# Patient Record
Sex: Female | Born: 1980 | Race: Black or African American | Hispanic: No | Marital: Single | Smoking: Never smoker
Health system: Southern US, Community
[De-identification: ages and names within clinical notes are randomized; demographics above are authoritative.]

## PROBLEM LIST (undated history)

## (undated) DIAGNOSIS — J45909 Unspecified asthma, uncomplicated: Secondary | ICD-10-CM

## (undated) HISTORY — PX: WISDOM TOOTH EXTRACTION: SHX21

---
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
## 2002-08-11 ENCOUNTER — Emergency Department (HOSPITAL_COMMUNITY): Admission: EM | Admit: 2002-08-11 | Discharge: 2002-08-11 | Payer: Self-pay | Admitting: Emergency Medicine

## 2002-08-11 ENCOUNTER — Encounter: Payer: Self-pay | Admitting: Emergency Medicine

## 2002-08-13 ENCOUNTER — Encounter: Payer: Self-pay | Admitting: Emergency Medicine

## 2002-08-13 ENCOUNTER — Emergency Department (HOSPITAL_COMMUNITY): Admission: EM | Admit: 2002-08-13 | Discharge: 2002-08-13 | Payer: Self-pay | Admitting: Emergency Medicine

## 2002-11-11 ENCOUNTER — Emergency Department (HOSPITAL_COMMUNITY): Admission: EM | Admit: 2002-11-11 | Discharge: 2002-11-11 | Payer: Self-pay | Admitting: Emergency Medicine

## 2002-11-11 ENCOUNTER — Encounter: Payer: Self-pay | Admitting: Emergency Medicine

## 2003-01-01 ENCOUNTER — Emergency Department (HOSPITAL_COMMUNITY): Admission: EM | Admit: 2003-01-01 | Discharge: 2003-01-01 | Payer: Self-pay | Admitting: Emergency Medicine

## 2003-01-02 ENCOUNTER — Emergency Department (HOSPITAL_COMMUNITY): Admission: EM | Admit: 2003-01-02 | Discharge: 2003-01-02 | Payer: Self-pay | Admitting: *Deleted

## 2003-08-21 ENCOUNTER — Emergency Department (HOSPITAL_COMMUNITY): Admission: EM | Admit: 2003-08-21 | Discharge: 2003-08-21 | Payer: Self-pay | Admitting: Emergency Medicine

## 2003-08-21 ENCOUNTER — Encounter: Payer: Self-pay | Admitting: Emergency Medicine

## 2003-11-30 ENCOUNTER — Emergency Department (HOSPITAL_COMMUNITY): Admission: EM | Admit: 2003-11-30 | Discharge: 2003-12-01 | Payer: Self-pay | Admitting: Emergency Medicine

## 2004-01-06 ENCOUNTER — Emergency Department (HOSPITAL_COMMUNITY): Admission: EM | Admit: 2004-01-06 | Discharge: 2004-01-07 | Payer: Self-pay | Admitting: Emergency Medicine

## 2004-02-10 ENCOUNTER — Emergency Department (HOSPITAL_COMMUNITY): Admission: EM | Admit: 2004-02-10 | Discharge: 2004-02-10 | Payer: Self-pay | Admitting: Emergency Medicine

## 2004-02-16 ENCOUNTER — Emergency Department (HOSPITAL_COMMUNITY): Admission: EM | Admit: 2004-02-16 | Discharge: 2004-02-16 | Payer: Self-pay | Admitting: Emergency Medicine

## 2004-03-31 ENCOUNTER — Inpatient Hospital Stay (HOSPITAL_COMMUNITY): Admission: EM | Admit: 2004-03-31 | Discharge: 2004-04-05 | Payer: Self-pay | Admitting: Psychiatry

## 2004-10-09 ENCOUNTER — Emergency Department (HOSPITAL_COMMUNITY): Admission: EM | Admit: 2004-10-09 | Discharge: 2004-10-10 | Payer: Self-pay | Admitting: Emergency Medicine

## 2006-01-19 ENCOUNTER — Emergency Department (HOSPITAL_COMMUNITY): Admission: EM | Admit: 2006-01-19 | Discharge: 2006-01-19 | Payer: Self-pay | Admitting: Emergency Medicine

## 2006-02-04 ENCOUNTER — Emergency Department (HOSPITAL_COMMUNITY): Admission: EM | Admit: 2006-02-04 | Discharge: 2006-02-04 | Payer: Self-pay | Admitting: Emergency Medicine

## 2006-07-26 ENCOUNTER — Emergency Department (HOSPITAL_COMMUNITY): Admission: EM | Admit: 2006-07-26 | Discharge: 2006-07-26 | Payer: Self-pay | Admitting: Emergency Medicine

## 2006-09-21 ENCOUNTER — Encounter (INDEPENDENT_AMBULATORY_CARE_PROVIDER_SITE_OTHER): Payer: Self-pay | Admitting: *Deleted

## 2006-09-21 ENCOUNTER — Ambulatory Visit: Payer: Self-pay | Admitting: Internal Medicine

## 2006-11-27 ENCOUNTER — Ambulatory Visit: Payer: Self-pay | Admitting: *Deleted

## 2006-12-28 ENCOUNTER — Emergency Department (HOSPITAL_COMMUNITY): Admission: EM | Admit: 2006-12-28 | Discharge: 2006-12-28 | Payer: Self-pay | Admitting: Emergency Medicine

## 2007-07-31 ENCOUNTER — Encounter (INDEPENDENT_AMBULATORY_CARE_PROVIDER_SITE_OTHER): Payer: Self-pay | Admitting: *Deleted

## 2007-11-12 ENCOUNTER — Telehealth (INDEPENDENT_AMBULATORY_CARE_PROVIDER_SITE_OTHER): Payer: Self-pay | Admitting: *Deleted

## 2007-12-06 ENCOUNTER — Ambulatory Visit: Payer: Self-pay | Admitting: Internal Medicine

## 2007-12-06 ENCOUNTER — Encounter (INDEPENDENT_AMBULATORY_CARE_PROVIDER_SITE_OTHER): Payer: Self-pay | Admitting: Internal Medicine

## 2007-12-06 DIAGNOSIS — E282 Polycystic ovarian syndrome: Secondary | ICD-10-CM

## 2007-12-10 ENCOUNTER — Encounter (INDEPENDENT_AMBULATORY_CARE_PROVIDER_SITE_OTHER): Payer: Self-pay | Admitting: Internal Medicine

## 2007-12-10 LAB — CONVERTED CEMR LAB
Chlamydia, DNA Probe: POSITIVE — AB
GC Probe Amp, Genital: NEGATIVE
Insulin: 8 microintl units/mL (ref 3–28)

## 2008-11-24 ENCOUNTER — Telehealth (INDEPENDENT_AMBULATORY_CARE_PROVIDER_SITE_OTHER): Payer: Self-pay | Admitting: *Deleted

## 2009-03-02 ENCOUNTER — Encounter (INDEPENDENT_AMBULATORY_CARE_PROVIDER_SITE_OTHER): Payer: Self-pay | Admitting: Internal Medicine

## 2009-03-02 ENCOUNTER — Ambulatory Visit: Payer: Self-pay | Admitting: Internal Medicine

## 2009-03-02 DIAGNOSIS — R1012 Left upper quadrant pain: Secondary | ICD-10-CM | POA: Insufficient documentation

## 2009-03-02 DIAGNOSIS — J309 Allergic rhinitis, unspecified: Secondary | ICD-10-CM | POA: Insufficient documentation

## 2009-03-02 DIAGNOSIS — E049 Nontoxic goiter, unspecified: Secondary | ICD-10-CM | POA: Insufficient documentation

## 2009-03-02 LAB — CONVERTED CEMR LAB
AST: 15 units/L (ref 0–37)
Alkaline Phosphatase: 41 units/L (ref 39–117)
BUN: 11 mg/dL (ref 6–23)
Basophils Absolute: 0 10*3/uL (ref 0.0–0.1)
Calcium: 8.9 mg/dL (ref 8.4–10.5)
Chloride: 112 meq/L (ref 96–112)
Creatinine, Ser: 0.91 mg/dL (ref 0.40–1.20)
Eosinophils Absolute: 0.2 10*3/uL (ref 0.0–0.7)
Eosinophils Relative: 2 % (ref 0–5)
HCT: 34.9 % — ABNORMAL LOW (ref 36.0–46.0)
HDL: 66 mg/dL (ref >39)
KOH Prep: NEGATIVE
MCHC: 32.1 g/dL (ref 30.0–36.0)
MCV: 94.6 fL (ref 78.0–100.0)
Platelets: 386 10*3/uL (ref 150–400)
RDW: 12.5 % (ref 11.5–15.5)
TSH: 1.132 microintl units/mL (ref 0.350–4.500)
Total CHOL/HDL Ratio: 2.7

## 2009-03-11 ENCOUNTER — Telehealth (INDEPENDENT_AMBULATORY_CARE_PROVIDER_SITE_OTHER): Payer: Self-pay | Admitting: Internal Medicine

## 2009-03-12 ENCOUNTER — Encounter (INDEPENDENT_AMBULATORY_CARE_PROVIDER_SITE_OTHER): Payer: Self-pay | Admitting: Internal Medicine

## 2009-12-23 ENCOUNTER — Ambulatory Visit: Payer: Self-pay | Admitting: Internal Medicine

## 2009-12-23 DIAGNOSIS — L293 Anogenital pruritus, unspecified: Secondary | ICD-10-CM | POA: Insufficient documentation

## 2009-12-23 LAB — CONVERTED CEMR LAB
Blood in Urine, dipstick: NEGATIVE
KOH Prep: NEGATIVE
Nitrite: NEGATIVE
WBC Urine, dipstick: NEGATIVE

## 2010-01-15 ENCOUNTER — Encounter (INDEPENDENT_AMBULATORY_CARE_PROVIDER_SITE_OTHER): Payer: Self-pay | Admitting: Internal Medicine

## 2010-02-22 ENCOUNTER — Ambulatory Visit: Payer: Self-pay | Admitting: Internal Medicine

## 2010-02-22 DIAGNOSIS — R3915 Urgency of urination: Secondary | ICD-10-CM | POA: Insufficient documentation

## 2010-02-22 DIAGNOSIS — F329 Major depressive disorder, single episode, unspecified: Secondary | ICD-10-CM | POA: Insufficient documentation

## 2010-02-22 DIAGNOSIS — N3941 Urge incontinence: Secondary | ICD-10-CM | POA: Insufficient documentation

## 2010-02-22 DIAGNOSIS — D649 Anemia, unspecified: Secondary | ICD-10-CM | POA: Insufficient documentation

## 2010-02-22 LAB — CONVERTED CEMR LAB
Bilirubin Urine: NEGATIVE
Blood in Urine, dipstick: NEGATIVE
Glucose, Urine, Semiquant: NEGATIVE
Ketones, urine, test strip: NEGATIVE
Protein, U semiquant: NEGATIVE
Whiff Test: POSITIVE

## 2010-02-25 LAB — CONVERTED CEMR LAB
Chlamydia, DNA Probe: NEGATIVE
GC Probe Amp, Genital: NEGATIVE
Lymphs Abs: 2.3 10*3/uL (ref 0.7–4.0)
Monocytes Relative: 7 % (ref 3–12)
Neutro Abs: 3.6 10*3/uL (ref 1.7–7.7)
Neutrophils Relative %: 55 % (ref 43–77)
RBC: 3.8 M/uL — ABNORMAL LOW (ref 3.87–5.11)
WBC: 6.5 10*3/uL (ref 4.0–10.5)

## 2010-02-28 ENCOUNTER — Ambulatory Visit (HOSPITAL_COMMUNITY): Admission: RE | Admit: 2010-02-28 | Discharge: 2010-02-28 | Payer: Self-pay | Admitting: Internal Medicine

## 2010-02-28 IMAGING — US US RENAL
1 series · 14 of 25 positions shown · non-contrast
Comparison: None.

CLINICAL DATA: Urinary frequency and problem emptying bladder

RENAL/URINARY TRACT ULTRASOUND COMPLETE

[Series 1: us renal · 0.26mm/px · 14 of 32 slices shown]
[im 1/32]
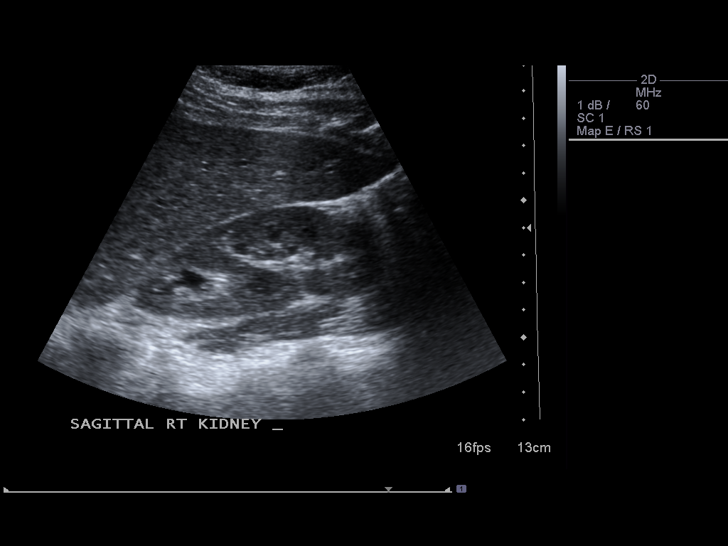
[im 3/32]
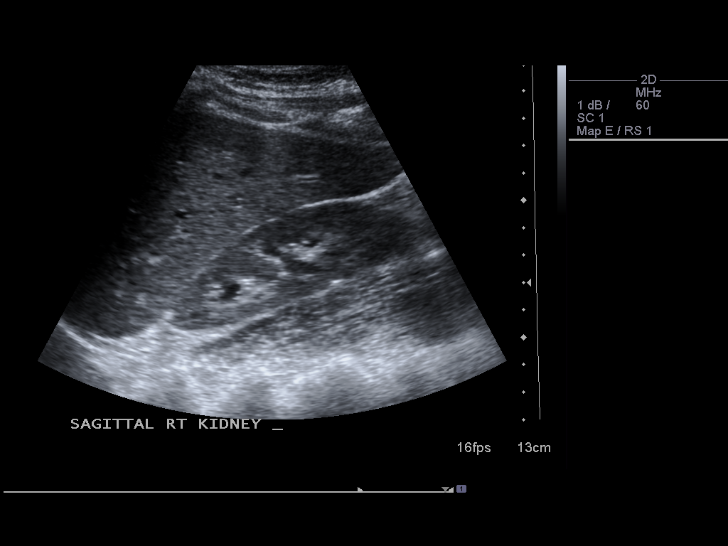
[im 6/32]
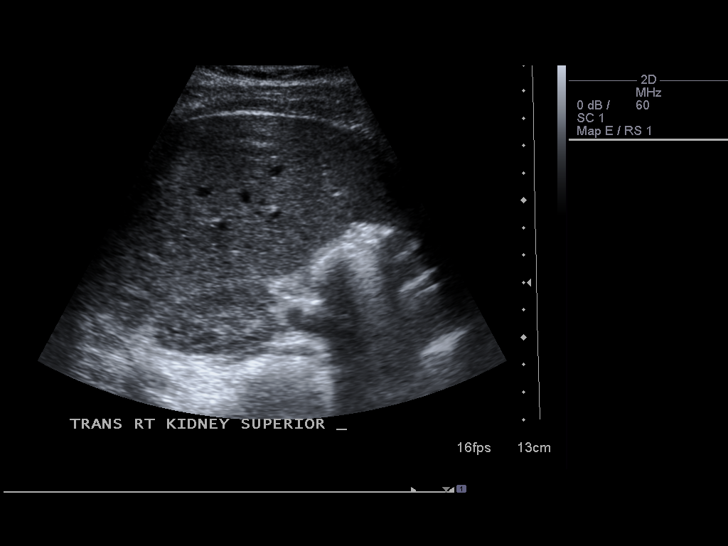
[im 8/32]
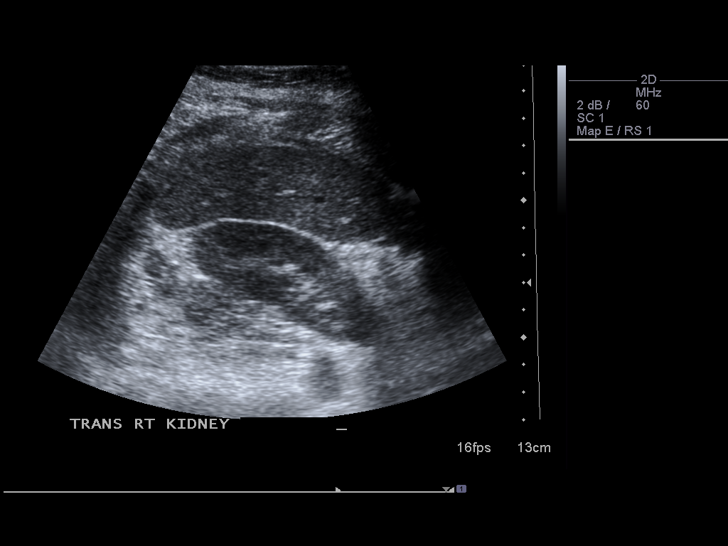
[im 11/32]
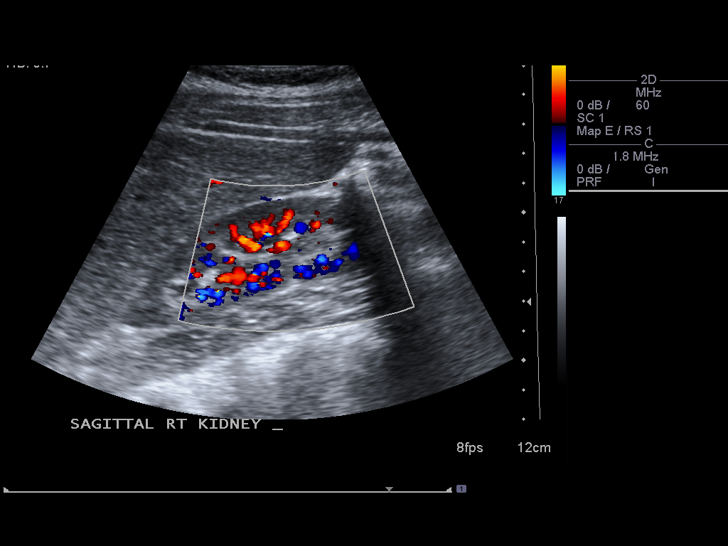
[im 12/32]
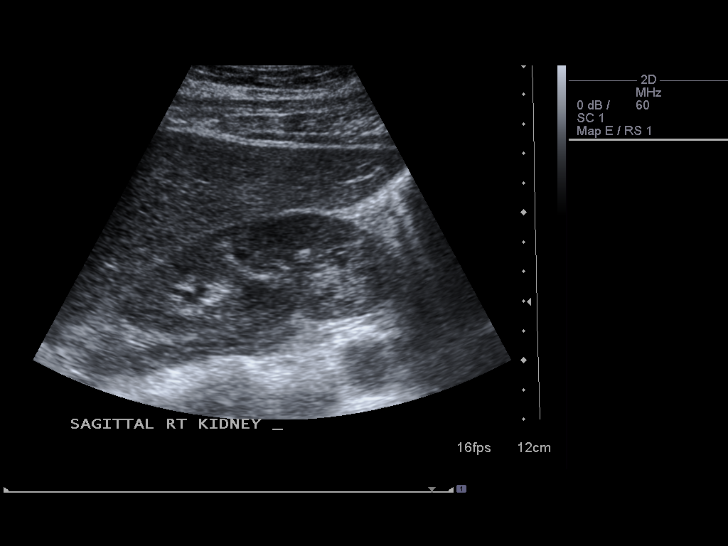
[im 15/32]
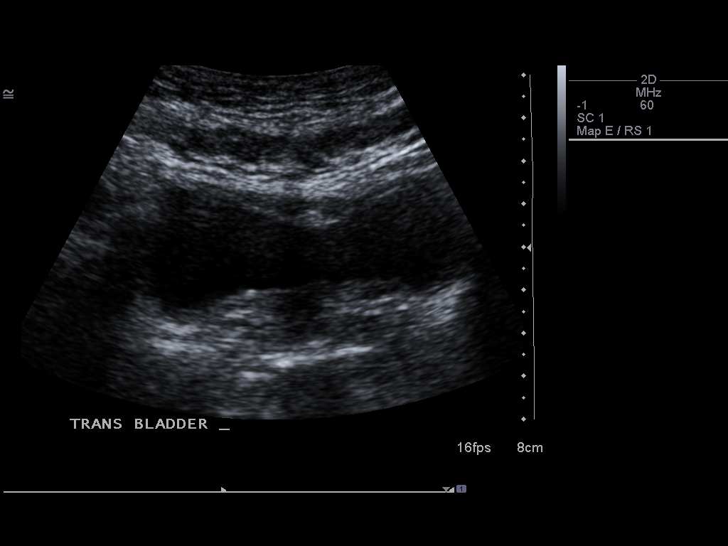
[im 17/32]
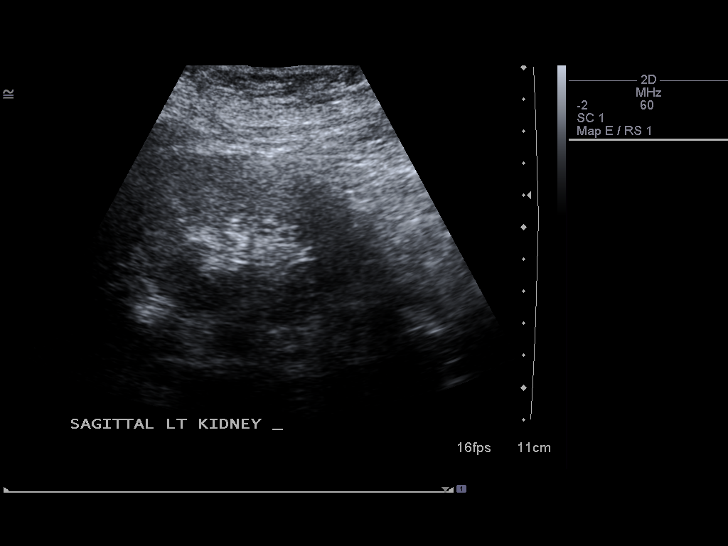
[im 20/32]
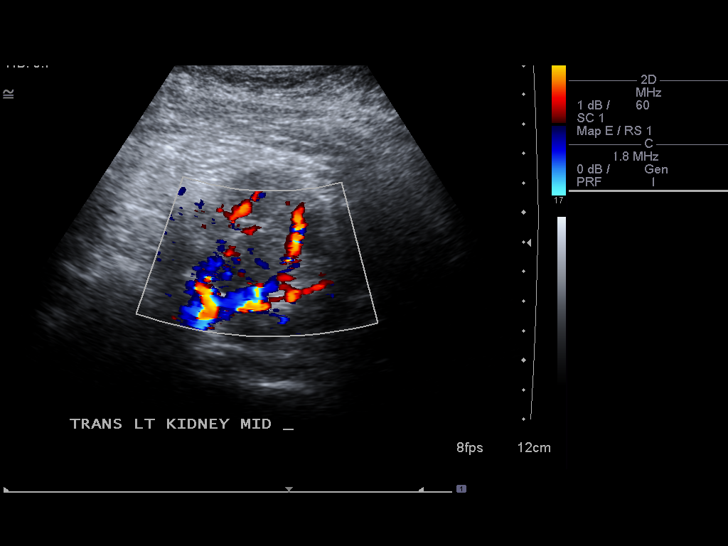
[im 21/32]
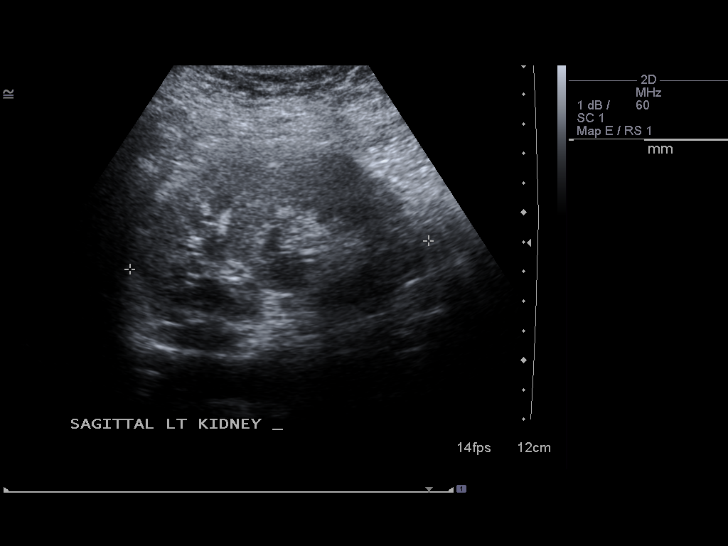
[im 24/32]
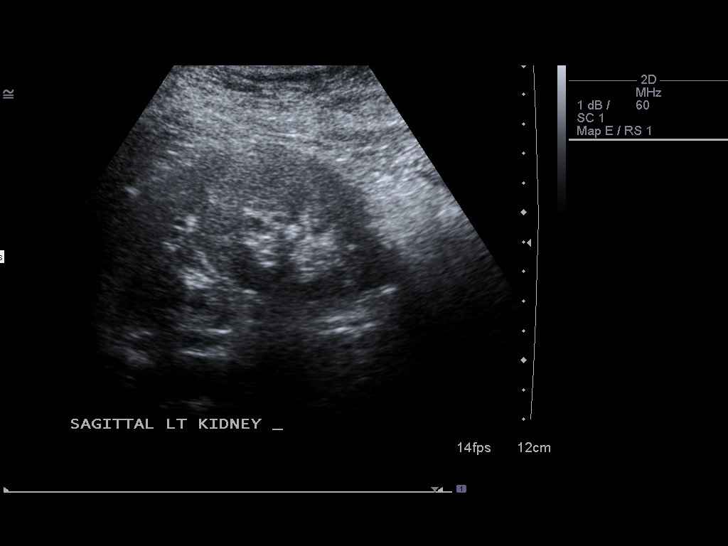
[im 26/32]
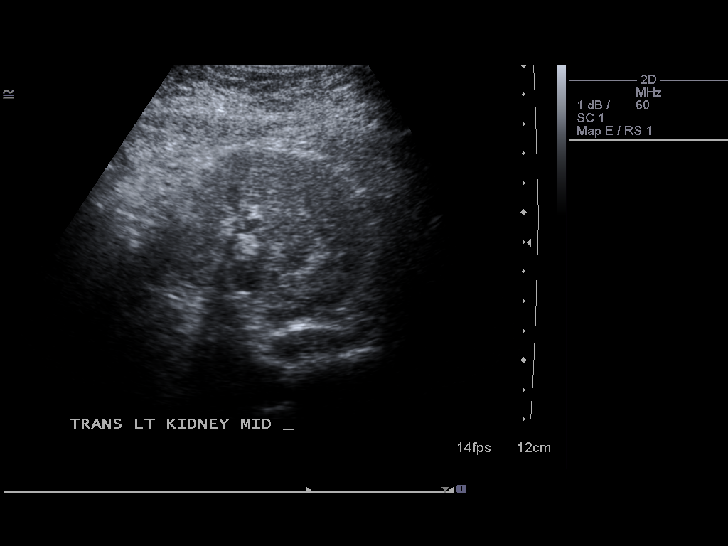
[im 29/32]
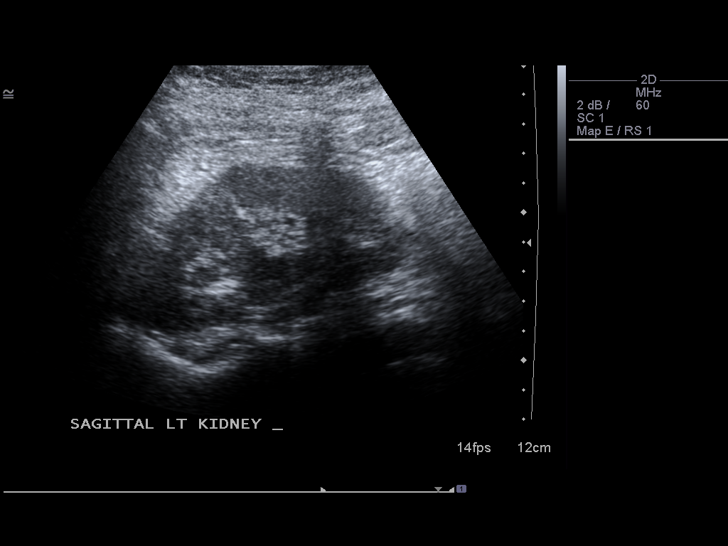
[im 32/32]
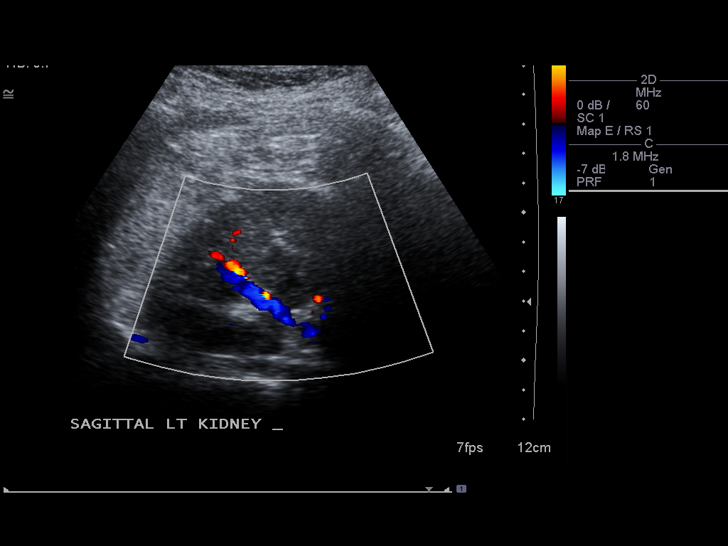

[14 of 25 positions shown; findings below may reference images not displayed]

FINDINGS: Right Kidney:  Normal in size and parenchymal echogenicity.  No
evidence of mass or hydronephrosis.

Left Kidney:  Normal in size and parenchymal echogenicity.  No
evidence of mass or hydronephrosis.

Bladder:  Appears normal for degree of bladder distention.
IMPRESSION: Normal study.

## 2010-03-08 ENCOUNTER — Ambulatory Visit: Payer: Self-pay | Admitting: Internal Medicine

## 2010-12-15 NOTE — Letter (Signed)
Summary: TEST ORDER FORM//ULTRASOUND//APPT DATE & TIME  TEST ORDER FORM//ULTRASOUND//APPT DATE & TIME   Imported By: Arta Bruce 02/22/2010 11:27:32  _____________________________________________________________________  External Attachment:    Type:   Image     Comment:   External Document

## 2010-12-15 NOTE — Letter (Signed)
Summary: AMANDA'S SUMMARY  AMANDA'S SUMMARY   Imported By: Arta Bruce 04/07/2010 12:39:30  _____________________________________________________________________  External Attachment:    Type:   Image     Comment:   External Document

## 2010-12-15 NOTE — Assessment & Plan Note (Signed)
Summary: CPP////KT   Vital Signs:  Patient profile:   31 year old female LMP:     02/01/2010 Weight:      165 pounds Temp:     98.2 degrees F Pulse rate:   72 / minute Pulse rhythm:   regular Resp:     18 per minute BP sitting:   113 / 73  (left arm) Cuff size:   regular  Vitals Entered By: Chauncy Passy, SMA CC: Pt. is here for a 30 y/o CPP. Pt. states she has noticed she's been urinating frequently for the last past 6-8 months.  Is Patient Diabetic? No Pain Assessment Patient in pain? no       Does patient need assistance? Functional Status Self care Ambulation Normal LMP (date): 02/01/2010     Enter LMP: 02/01/2010 Last PAP Result NEGATIVE FOR INTRAEPITHELIAL LESIONS OR MALIGNANCY.   CC:  Pt. is here for a 30 y/o CPP. Pt. states she has noticed she's been urinating frequently for the last past 6-8 months. .  History of Present Illness: 30 yo female here for CPP.  Concerns:  1.  Urinary frequency in past 6 mos-1 year.  Nocturia x 2.   Can be just a little amount or a lot.  Has been doing Kegels and has helped.  Does not drink much in way of caffeine.  Alcohol only once monthly.  No burning on urination.  Often feels like she does not completely empty.  In past, has had times where urine has leaked as she could not make it to bathroom fast enough.   2.  Asthma:  did not get flu shot this past year.  Uses Proventil only once ever 4-6 months.  Seasonal allergies are a definite trigger and she is out of Fexofenadine.    Habits & Providers  Alcohol-Tobacco-Diet     Alcohol drinks/day: <1     Tobacco Status: quit < 6 months     Year Started: age 60  Exercise-Depression-Behavior     Drug Use: never  Current Medications (verified): 1)  Desogen 0.15-30 Mg-Mcg  Tabs (Desogestrel-Ethinyl Estradiol) .... Take 1 Tablet By Mouth Once A Day As Directed 2)  Proventil Hfa 108 (90 Base) Mcg/act  Aers (Albuterol Sulfate) .... Use 2 Puffs Every 4 Hours As Needed 3)   Fexofenadine Hcl 180 Mg Tabs (Fexofenadine Hcl) .Marland Kitchen.. 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 None  Family History: Mother, 25:  Thyroid disease, lung cancer--treated with radiation Father, 32:  DM-insulin requiring, depression Sister, 76: Healthy, overweight. No children.  Social History: Lives alone now. Works with [employer].  Getting CNA /med Actuary.  [employer]. Does have a significant other Tobacco Use:  Started age 59, quits on and off--currently off for 2 months  Smoked 1 pack in 3 weeks. Alcohol Use:  Rare. Drug Use: never.Smoking Status:  quit < 6 months Drug Use:  never  Review of Systems General:  Energy is better--has made lifestyle changes--losing weight.. Eyes:  Denies blurring. ENT:  Denies decreased hearing. CV:  Denies chest pain or discomfort and palpitations. Resp:  No dyspnea for some time with asthma. GI:  Denies abdominal pain,  bloody stools, constipation, dark tarry stools, and diarrhea. GU:  Denies discharge; periods regular with BCPs.  Can be somewhat heavy--but nothing like before started BCPs.. MS:  Occasional cramping of hands--when trying to open jar, etc. Can have numbness of hands at night--not frequent.. Derm:  Denies lesion(s) and rash. Neuro:  Denies numbness, tingling, and weakness; other than hands as noted previously. Psych:  Denies anxiety and depression; Has had a history of depression--was at [facility] at one point.  Does have episodes of coping problems--doesn't want to go to  work, wants to stay home and not see anyone--once every 1-2 months.  Feels physically ill with this at times..  Physical Exam  General:  Well-developed,well-nourished,in no acute distress; alert,appropriate and cooperative throughout examination Head:  Normocephalic and atraumatic without obvious abnormalities. No apparent alopecia or balding. Eyes:  No corneal or conjunctival inflammation noted. EOMI. Perrla. Funduscopic exam benign, without hemorrhages, exudates or papilledema. Vision grossly normal. Ears:  External ear exam shows no significant lesions or deformities.  Otoscopic examination reveals clear canals, tympanic membranes are intact bilaterally without bulging, retraction, inflammation or discharge. Hearing is grossly normal bilaterally. Nose:  External nasal examination shows no deformity or inflammation. Nasal mucosa are pink and moist without lesions or exudates. Mouth:  Oral mucosa and oropharynx without lesions or exudates.  Teeth in good repair. Neck:  No deformities, masses, or tenderness noted.  No thyromegaly noted today. Breasts:  No mass, nodules, thickening, tenderness, bulging, retraction, inflamation, nipple discharge or skin changes noted.   Lungs:  Normal respiratory effort, chest expands symmetrically. Lungs are clear to auscultation, no crackles or wheezes. Heart:  Normal rate and regular rhythm. S1 and S2 normal without gallop, murmur, click, rub or other extra sounds. Abdomen:  Bowel sounds positive,abdomen soft and non-tender without masses, organomegaly or hernias noted. Genitalia:  Pelvic Exam:        External: normal female genitalia without lesions or masses        Vagina: normal without lesions or masses        Cervix: normal without lesions or masses.  Scant frothy thin yellow discharge.  No inflammation of cervix        Adnexa: normal bimanual exam without masses or fullness        Uterus: normal by palpation        Pap smear: performed Msk:  No deformity  or scoliosis noted of thoracic or lumbar spine.   Pulses:  R and L carotid,radial,femoral,dorsalis pedis and posterior tibial pulses are full and equal bilaterally Extremities:  No clubbing, cyanosis, edema, or deformity noted with normal full range of motion of all joints.   Neurologic:  No cranial nerve deficits noted. Station and gait are normal. Plantar reflexes are down-going bilaterally. DTRs are symmetrical throughout. Sensory, motor and coordinative functions appear intact.  No Tinel's or Phalen's at wrists. Skin:  Several small tattoos. umbilical piercing. Cervical Nodes:  No lymphadenopathy noted Axillary Nodes:  No palpable lymphadenopathy Inguinal Nodes:  No significant adenopathy Psych:  Cognition and judgment appear intact. Alert and cooperative with normal attention span and concentration. No apparent delusions, illusions, hallucinations   Impression & Recommendations:  Problem # 1:  ROUTINE GYNECOLOGICAL EXAMINATION (ICD-V72.31) Encouraged more dairy intake Orders: UA Dipstick w/o Micro (manual) (13244) Pap Smear, Thin Prep ( Collection of) [phone]) KOH/ WET Mount [phone]) T- GC Chlamydia [phone]) T-HIV Antibody  (Reflex) [phone]) T-Pap Smear, Thin Prep (56433) T-Syphilis Test (RPR) ([id number]) Ultrasound (Ultrasound)  Problem # 2:  ANEMIA (  ICD-285.9)  Orders: T-CBC w/Diff ([id number])  Problem # 3:  ASTHMA (ICD-493.90) Encouraged yearly flu and to stay off cigarettes. Her updated medication list for this problem includes:    Proventil Hfa 108 (90 Base) Mcg/act Aers (Albuterol sulfate) ..... Use 2 puffs every 4 hours as needed  Problem # 4:  DEPRESSION (ICD-311) PHQ -9 scored at 11 as well. Orders: Psychology Referral (Psychology)  Problem # 5:  URINARY URGENCY, CHRONIC (ICD-788.63) renal and collecting system ultrasound--to evaluate for any obstruction with sense of inability to empty Then start Detrol--pt. to hold on obtaining until results of  ultrasound.  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 1 tab by mouth two times a day for 7 days  Complete Medication List: 1)  Desogen 0.15-30 Mg-mcg Tabs (Desogestrel-ethinyl estradiol) .... Take 1 tablet by mouth once a day as directed 2)  Proventil Hfa 108 (90 Base) Mcg/act Aers (Albuterol sulfate) .... Use 2 puffs every 4 hours as needed 3)  Cetirizine Hcl 10 Mg Tabs (Cetirizine hcl) .Marland Kitchen.. 1 tab by mouth daily as needed allergies 4)  Metronidazole 500 Mg Tabs (Metronidazole) .Marland Kitchen.. 1 tab by mouth two times a day for 7 days 5)  Detrol La 2 Mg Xr24h-cap (Tolterodine tartrate) .Marland Kitchen.. 1 tab by mouth daily  Patient Instructions: 1)  Referral to [facility] 2)  Follow up with Dr. Delrae Alfred in 2 months --urinary symptoms Prescriptions: PROVENTIL HFA 108 (90 BASE) MCG/ACT  AERS (ALBUTEROL SULFATE) use 2 puffs every 4 hours as needed  #1 x 0   Entered and Authorized by:   [name] MD   Signed by:   [name] MD on 02/22/2010   Method used:   Faxed to ...       [facility] - Pharmac (retail)       [address] [place], [place]  [address]       Ph: [phone]       Fax: [phone]   RxID:   [id number] DESOGEN 0.15-30 MG-MCG  TABS (DESOGESTREL-ETHINYL ESTRADIOL) Take 1 tablet by mouth once a day as directed  #1 pack x 11   Entered and Authorized by:   [name] MD   Signed by:   [name] MD on 02/22/2010   Method used:   Faxed to ...       [facility] - Pharmac (retail)       [address] [place], [place]  [address]       Ph: [phone]       Fax: [phone]   RxID:   [id number] DETROL LA 2 MG XR24H-CAP (TOLTERODINE TARTRATE) 1 tab by mouth daily  #30 x 11   Entered and Authorized by:   [name] MD   Signed by:   [name] MD on 02/22/2010   Method used:   Faxed to ...       [facility] - Pharmac (retail)       [address] [place], [place]  [address]       Ph: [phone]       Fax: [phone]   RxID:   [id number] METRONIDAZOLE 500 MG TABS (METRONIDAZOLE) 1 tab by mouth two times a day for 7 days  #14 x 0  Entered and Authorized by:   [name] MD   Signed by:   [name] MD on 02/22/2010   Method used:   Faxed to ...       [facility] - Pharmac (retail)       [address] [place], [place]  [address]       Ph: [phone]       Fax: [phone]   RxID:   [id number] CETIRIZINE HCL 10 MG TABS (CETIRIZINE HCL) 1 tab by mouth daily as needed allergies  #30 x 11   Entered and Authorized by:   [name] MD   Signed by:   [name] MD on 02/22/2010   Method used:   Faxed to ...       [facility] - Pharmac (retail)       [address] [place], [place]  [address]       Ph: [phone]       Fax: [phone]   RxID:   [id number]    Preventive Care Screening  Prior Values:    Pap Smear:  NEGATIVE FOR INTRAEPITHELIAL LESIONS OR MALIGNANCY. (03/02/2009)    Last Tetanus Booster:  Historical (11/14/2003)    Last Pneumovax:  Pneumovax (03/02/2009)     SBE:  yes--no changes. LMP:  3 weeks ago Osteoprevention:  1-2 cups of milk daily.  Yogurt 4 times weekly.  Is exercising regularly.  Laboratory Results   Urine Tests    Routine Urinalysis   Glucose: negative   (Normal Range: Negative) Bilirubin: negative   (Normal Range: Negative) Ketone: negative   (Normal Range: Negative) Spec. Gravity: >=1.030   (Normal Range: 1.003-1.035) Blood: negative   (Normal Range: Negative) pH: 5.0   (Normal Range: 5.0-8.0) Protein: negative   (Normal Range: Negative) Urobilinogen: 0.2   (Normal Range: 0-1) Nitrite: negative   (Normal Range:  Negative) Leukocyte Esterace: negative   (Normal Range: Negative)      Wet Mount Source: vagina WBC/hpf: 10-20 Bacteria/hpf: 2+ Clue cells/hpf: many  Positive whiff Yeast/hpf: none Wet Mount KOH: Negative Trichomonas/hpf: none    Laboratory Results   Urine Tests    Routine Urinalysis   Glucose: negative   (Normal Range: Negative) Bilirubin: negative   (Normal Range: Negative) Ketone: negative   (Normal Range: Negative) Spec. Gravity: >=1.030   (Normal Range: 1.003-1.035) Blood: negative   (Normal Range: Negative) pH: 5.0   (Normal Range: 5.0-8.0) Protein: negative   (Normal Range: Negative) Urobilinogen: 0.2   (Normal Range: 0-1) Nitrite: negative   (Normal Range: Negative) Leukocyte Esterace: negative   (Normal Range: Negative)      Wet Mount/KOH  Positive whiff   Appended Document: CPP////KT  Laboratory Results    Other Tests  Rapid HIV: negative

## 2010-12-15 NOTE — Letter (Signed)
Summary: *HSN Results Follow up  [facility]  [address] [place], [place] [address]   Phone: [phone]  Fax: [phone]      01/15/2010   Brianna Hart [address] [place], [place]  [address]   Dear  Ms. [name],                            ____S.Drinkard,FNP   ____D. Gore,FNP       ____B. McPherson,MD   ____V. Rankins,MD    __X__[name],MD    ____N. Daphine Deutscher, FNP  ____D. Reche Dixon, MD    ____K. Philipp Deputy, MD    ____Other     This letter is to inform you that your recent test(s):  _______Pap Smear    ___X____Lab Test     _______X-ray    ____X___ is within acceptable limits  _______ requires a medication change  _______ requires a follow-up lab visit  _______ requires a follow-up visit with your provider   Comments:  Swab for gonorrhea and chlamydia was negative--no infection with those       _________________________________________________________ If you have any questions, please contact our office                     Sincerely,  [name] MD [facility]

## 2010-12-15 NOTE — Progress Notes (Signed)
Summary: Office Visit//DEPRESSION SCREENING  Office Visit//DEPRESSION SCREENING   Imported By: Arta Bruce 04/27/2010 12:56:28  _____________________________________________________________________  External Attachment:    Type:   Image     Comment:   External Document

## 2010-12-15 NOTE — Assessment & Plan Note (Signed)
Summary: personal issues//gk   Vital Signs:  Patient profile:   30 year old female Weight:      169 pounds BMI:     30.05 Temp:     98.5 degrees F Pulse rate:   81 / minute Pulse rhythm:   regular Resp:     16 per minute BP sitting:   114 / 73  (left arm) Cuff size:   regular  Vitals Entered By: Vesta Mixer CMA (December 23, 2009 10:38 AM) CC: Has sex about one month ago and condom was left behind in her, but she pulled it out but has been itching a lot since then also has been peeing a lot since before the condom inceident. Is Patient Diabetic? No Pain Assessment Patient in pain? no       Does patient need assistance? Ambulation Normal   CC:  Has sex about one month ago and condom was left behind in her and but she pulled it out but has been itching a lot since then also has been peeing a lot since before the condom inceident..  History of Present Illness: 1.  Vaginal itching about 1 month ago.  States Condom with spermicidal jelly came off with intercourse--she was able to get condom out immediately.  Had itching only that night--none since.  No discharge.  Has had a normal period since--2 weeks ago.  This is a monogamous year long relationship as far as she knows and he has not had any symptoms related to genitalia.  No odor, itching or discharge since.    Allergies (verified): No Known Drug Allergies  Physical Exam  General:  NAD Genitalia:  normal introitus, no external lesions, no vaginal discharge, and no vaginal or cervical lesions.  No CMT   Impression & Recommendations:  Problem # 1:  VAGINAL PRURITUS (ICD-698.1)  Short lived and resolved--probably secondary to spermicidal gel from condom that was retained for a very short period of time.  Orders: T- GC Chlamydia ([address]) KOH/ WET Mount [phone])  Complete Medication List: 1)  Desogen 0.15-30 Mg-mcg Tabs (Desogestrel-ethinyl estradiol) .... Take 1 tablet by mouth once a day as directed 2)  Proventil Hfa  108 (90 Base) Mcg/act Aers (Albuterol sulfate) .... Use 2 puffs every 4 hours as needed 3)  Fexofenadine Hcl 180 Mg Tabs (Fexofenadine hcl) .Marland Kitchen.. 1 tab by mouth daily as needed allergies  Patient Instructions: 1)  CPP with Dr. Delrae Alfred on or after 02/20/10  Laboratory Results   Urine Tests    Routine Urinalysis   Glucose: negative   (Normal Range: Negative) Bilirubin: negative   (Normal Range: Negative) Ketone: negative   (Normal Range: Negative) Spec. Gravity: >=1.030   (Normal Range: 1.003-1.035) Blood: negative   (Normal Range: Negative) pH: 5.0   (Normal Range: 5.0-8.0) Protein: negative   (Normal Range: Negative) Urobilinogen: 0.2   (Normal Range: 0-1) Nitrite: negative   (Normal Range: Negative) Leukocyte Esterace: negative   (Normal Range: Negative)      Wet Mount Source: vaginal WBC/hpf: 5-10 Bacteria/hpf: 1+ Clue cells/hpf: none  Negative whiff Yeast/hpf: none Wet Mount KOH: Negative Trichomonas/hpf: none   Laboratory Results   Urine Tests    Routine Urinalysis   Glucose: negative   (Normal Range: Negative) Bilirubin: negative   (Normal Range: Negative) Ketone: negative   (Normal Range: Negative) Spec. Gravity: >=1.030   (Normal Range: 1.003-1.035) Blood: negative   (Normal Range: Negative) pH: 5.0   (Normal Range: 5.0-8.0) Protein: negative   (Normal Range: Negative) Urobilinogen: 0.2   (  Normal Range: 0-1) Nitrite: negative   (Normal Range: Negative) Leukocyte Esterace: negative   (Normal Range: Negative)      Wet Mount/KOH  Negative whiff

## 2010-12-28 ENCOUNTER — Telehealth (INDEPENDENT_AMBULATORY_CARE_PROVIDER_SITE_OTHER): Payer: Self-pay | Admitting: Internal Medicine

## 2011-01-10 NOTE — Progress Notes (Signed)
Summary: Needs BCP  Phone Note Call from Patient Call back at Home Phone [phone]   Summary of Call: pt called to see if she can have a refill on BCP til her next cpp which is in April...Marland KitchenMarland Kitchen pt says she has one refill left and she will pick that up on Friday... [facility] Initial call taken by: Arm[place] Shannon,  December 28, 2010 11:07 AM  Follow-up for Phone Call        Sent to E. Josselyn Harkins.  Dutch Quint RN  December 28, 2010 11:58 AM   Additional Follow-up for Phone Call Additional follow up Details #1::        Okay to fill until appt. in April Additional Follow-up by: [name] MD,  January 04, 2011 12:13 PM    Additional Follow-up for Phone Call Additional follow up Details #2::    Noted.  Refill completed.  Pt. notified.  Dutch Quint RN  January 05, 2011 9:35 AM   Prescriptions: DESOGEN 0.15-30 MG-MCG  TABS (DESOGESTREL-ETHINYL ESTRADIOL) Take 1 tablet by mouth once a day as directed  #1 pack x 1   Entered by:   Dutch Quint RN   Authorized by:   [name] MD   Signed by:   Dutch Quint RN on 01/05/2011   Method used:   [facility] [address] [phone][facility] (retail)       [address]       [place], [place]  [address]       Ph: [phone] or [phone]       Fax: [phone]   RxID:   [id number]

## 2011-02-24 ENCOUNTER — Other Ambulatory Visit: Payer: Self-pay | Admitting: Internal Medicine

## 2011-03-24 ENCOUNTER — Emergency Department (HOSPITAL_COMMUNITY)
Admission: EM | Admit: 2011-03-24 | Discharge: 2011-03-25 | Disposition: A | Discharge status: Home / Self Care | Payer: Self-pay | Attending: Emergency Medicine | Admitting: Emergency Medicine

## 2011-03-24 DIAGNOSIS — B029 Zoster without complications: Secondary | ICD-10-CM | POA: Insufficient documentation

## 2011-03-31 NOTE — Discharge Summary (Signed)
NAME:  Brianna Hart, Brianna Hart                         ACCOUNT NO.:  [id number]   MEDICAL RECORD NO.:  [id number]                   PATIENT TYPE:  IPS   LOCATION:  0501                                 FACILITY:  [facility]   PHYSICIAN:  Geoffery Lyons, M.D.                   DATE OF BIRTH:  November 12, 1981   DATE OF ADMISSION:  03/31/2004  DATE OF DISCHARGE:  04/05/2004                                 DISCHARGE SUMMARY   CHIEF COMPLAINT AND PRESENT ILLNESS:  This was the first admission to [facility] for this 30 year old single African-American female  voluntarily admitted.  Presented requesting help with depression, one or two  days of increased crying spells, one week of increased suicidal thoughts.  Reported depression for six years since high school.  Had counseling but no  medications.  Reported depressed mood, decreased ambition, feeling  worthless, 60-pound weight gain in a year, increased crying, feeling  hopeless, some social anxiety, felt like a failure.   PAST PSYCHIATRIC HISTORY:  First time in treatment.  No previous attempts  with medication.   ALCOHOL/DRUG HISTORY:  Denies the use or abuse of any substances.   PAST MEDICAL HISTORY:  Asthma.   MEDICATIONS:  Albuterol inhaler.   PHYSICAL EXAMINATION:  Performed and failed to show any acute findings.   LABORATORY DATA:  CBC within normal limits.  Blood chemistry within normal  limits.  Thyroid profile within normal limits.  Drug screen negative for  substances of abuse.   MENTAL STATUS EXAM:  Fully alert, pleasant, cooperative female.  Tearful.  Speech soft tone, decreased amount, normal pace.  Mood depressed, sense of  worthlessness, helplessness.  Affect depressed, worthlessness, helplessness.  Thought processes positive for suicidal ideation without a clear plan,  feeling overwhelmed.  Not able to cope.  Cognition well-preserved.   ADMISSION DIAGNOSES:   AXIS I:  Major depression.   AXIS II:  No diagnosis.   AXIS III:  Asthma.   AXIS IV:  Moderate.   AXIS V:  Global Assessment of Functioning upon admission 35; highest Global  Assessment of Functioning in the last year 65.   HOSPITAL COURSE:  She was admitted and started intensive individual and  group psychotherapy.  She was given Ambien for sleep.  She was given Ativan  as needed for anxiety.  She was placed on albuterol inhalers and started on  Zoloft 25 mg, that was increased to 50 mg per day.  She was able to open up  and deal with the events that led to the admission, the losses.  As she  continued to open up and started sleeping at night, her mood improved.  Her  affect started becoming a little brighter.  Tolerated the Zoloft well.  On  Apr 05, 2004, she was in full contact with reality.  There were no suicidal  ideation, no homicidal ideation, no hallucinations, no delusions.  Feeling  much better.  Worked on Pharmacologist, stress management.  Endorsed no  suicidal ideation, no homicidal ideation.  Willing and motivated to pursue  further outpatient treatment.  We discharged to outpatient follow-up.   DISCHARGE DIAGNOSES:   AXIS I:  Major depression.   AXIS II:  No diagnosis.   AXIS III:  Asthma.   AXIS IV:  Moderate.   AXIS V:  Global Assessment of Functioning upon discharge 50-55.   DISCHARGE MEDICATIONS:  1. Ambien 10 mg at bedtime for sleep.  2. Zoloft 50 mg daily.  3. Albuterol 1 puff every four hours as needed.   OBJECTIVE:  [facility].                                               Geoffery Lyons, M.D.    IL/MEDQ  D:  04/27/2004  T:  04/27/2004  Job:  [id number]

## 2012-03-07 ENCOUNTER — Other Ambulatory Visit: Payer: Self-pay | Admitting: Internal Medicine

## 2012-04-24 ENCOUNTER — Encounter: Payer: Self-pay | Admitting: Physician Assistant

## 2012-04-24 ENCOUNTER — Telehealth: Payer: Self-pay | Admitting: Obstetrics & Gynecology

## 2012-04-24 NOTE — Telephone Encounter (Signed)
Called patient to inform her provider was sick, and would have to reschedule her appointment. Patient stated she would no longer need this appointment. Was to have a Colpo today.

## 2012-05-02 ENCOUNTER — Encounter: Payer: Self-pay | Admitting: Obstetrics & Gynecology

## 2013-08-13 ENCOUNTER — Encounter (HOSPITAL_COMMUNITY): Payer: Self-pay | Admitting: Emergency Medicine

## 2013-08-13 ENCOUNTER — Emergency Department (HOSPITAL_COMMUNITY)
Admission: EM | Admit: 2013-08-13 | Discharge: 2013-08-13 | Disposition: A | Discharge status: Home / Self Care | Payer: Self-pay | Attending: Emergency Medicine | Admitting: Emergency Medicine

## 2013-08-13 DIAGNOSIS — Z79899 Other long term (current) drug therapy: Secondary | ICD-10-CM | POA: Insufficient documentation

## 2013-08-13 DIAGNOSIS — J45901 Unspecified asthma with (acute) exacerbation: Secondary | ICD-10-CM | POA: Insufficient documentation

## 2013-08-13 DIAGNOSIS — IMO0002 Reserved for concepts with insufficient information to code with codable children: Secondary | ICD-10-CM | POA: Insufficient documentation

## 2013-08-13 HISTORY — DX: Unspecified asthma, uncomplicated: J45.909

## 2013-08-13 MED ORDER — IPRATROPIUM BROMIDE 0.02 % IN SOLN
0.5000 mg | Freq: Once | RESPIRATORY_TRACT | Status: AC
  Administered 2013-08-13: 0.5 mg via RESPIRATORY_TRACT
  Filled 2013-08-13: qty 2.5

## 2013-08-13 MED ORDER — PREDNISONE 20 MG PO TABS: 40.0000 mg | ORAL_TABLET | Freq: Every day | ORAL | Status: DC

## 2013-08-13 MED ORDER — ALBUTEROL SULFATE (5 MG/ML) 0.5% IN NEBU
5.0000 mg | INHALATION_SOLUTION | Freq: Once | RESPIRATORY_TRACT | Status: AC
  Administered 2013-08-13: 5 mg via RESPIRATORY_TRACT
  Filled 2013-08-13: qty 1

## 2013-08-13 MED ORDER — PREDNISONE 20 MG PO TABS
60.0000 mg | ORAL_TABLET | Freq: Once | ORAL | Status: AC
  Administered 2013-08-13: 60 mg via ORAL
  Filled 2013-08-13: qty 3

## 2013-08-13 MED ORDER — ALBUTEROL SULFATE (2.5 MG/3ML) 0.083% IN NEBU: 2.5000 mg | INHALATION_SOLUTION | Freq: Four times a day (QID) | RESPIRATORY_TRACT | Status: DC | PRN

## 2013-08-13 MED ORDER — ALBUTEROL SULFATE HFA 108 (90 BASE) MCG/ACT IN AERS
2.0000 | INHALATION_SPRAY | Freq: Once | RESPIRATORY_TRACT | Status: AC
  Administered 2013-08-13: 2 via RESPIRATORY_TRACT
  Filled 2013-08-13: qty 6.7

## 2013-08-13 NOTE — ED Provider Notes (Signed)
CSN: [id number]     Arrival date & time 08/13/13  1940 History  This chart was scribed for Antony Madura, PA, working with Celene Kras, MD by Blanchard Kelch, ED Scribe. This patient was seen in room WTR5/WTR5 and the patient's care was started at 9:23 PM.    Chief Complaint  Patient presents with  . Asthma    Patient is a 32 y.o. female presenting with asthma and shortness of breath. The history is provided by the patient. No language interpreter was used.  Asthma This is a chronic problem. The current episode started 2 days ago. The problem occurs constantly. Associated symptoms include shortness of breath.  Shortness of Breath Severity:  Moderate Onset quality:  Gradual Duration:  2 days Timing:  Constant Progression:  Worsening Chronicity:  Chronic Relieved by:  Inhaler Associated symptoms: no fever, no syncope and no vomiting     HPI Comments: Brianna Hart is a 32 y.o. female with a history of asthma who presents to the Emergency Department complaining of constant, worsening shortness of breath that began two days ago. She complains of associated mild chest tightness. She has been using a rescue inhaler with moderate relief but she ran out of it yesterday. She also has a nebulizer at home but has run out of that recently. She states these symptoms feel similar to her asthma attacks. She was hospitalized for an asthma attack about fifteen year ago. She denies ever having to be intubated for her asthma attacks. She denies nausea, vomiting, congestion, rhinorrhea, fever or syncope.    Past Medical History  Diagnosis Date  . Asthma    History reviewed. No pertinent past surgical history. No family history on file. History  Substance Use Topics  . Smoking status: Not on file  . Smokeless tobacco: Not on file  . Alcohol Use: Not on file   OB History   Grav Para Term Preterm Abortions TAB SAB Ect Mult Living                 Review of Systems  Constitutional: Negative for  fever.  HENT: Negative for congestion and rhinorrhea.   Respiratory: Positive for chest tightness and shortness of breath.   Cardiovascular: Negative for syncope.  Gastrointestinal: Negative for nausea and vomiting.  Neurological: Negative for syncope.  All other systems reviewed and are negative.    Allergies  Review of patient's allergies indicates no known allergies.  Home Medications   Current Outpatient Rx  Name  Route  Sig  Dispense  Refill  . albuterol (PROVENTIL HFA;VENTOLIN HFA) 108 (90 BASE) MCG/ACT inhaler   Inhalation   Inhale 2 puffs into the lungs every 6 (six) hours as needed for wheezing.         Marland Kitchen albuterol (PROVENTIL) (2.5 MG/3ML) 0.083% nebulizer solution   Nebulization   Take 3 mLs (2.5 mg total) by nebulization every 6 (six) hours as needed for wheezing.   75 mL   0   . predniSONE (DELTASONE) 20 MG tablet   Oral   Take 2 tablets (40 mg total) by mouth daily.   10 tablet   0    Triage Vitals: BP 117/55  Pulse 60  Temp(Src) 97.7 F (36.5 C) (Oral)  Resp 24  SpO2 99%  LMP 05/13/2013  Physical Exam  Nursing note and vitals reviewed. Constitutional: She is oriented to person, place, and time. She appears well-developed and well-nourished. No distress.  HENT:  Head: Normocephalic and atraumatic.  Mouth/Throat: Oropharynx  is clear and moist. No oropharyngeal exudate.  Eyes: Conjunctivae and EOM are normal. Pupils are equal, round, and reactive to light. No scleral icterus.  Neck: Normal range of motion. Neck supple.  Cardiovascular: Normal rate, regular rhythm and normal heart sounds.   Pulmonary/Chest: Effort normal and breath sounds normal. No stridor. No respiratory distress. She has no wheezes. She has no rales.  Lungs clear to ausculation bilaterally. Good inspiratory effort. No accessory muscle use or retractions.  Abdominal: Soft. She exhibits no distension. There is no tenderness.  Musculoskeletal: Normal range of motion.  Neurological:  She is alert and oriented to person, place, and time.  Skin: Skin is warm and dry. No rash noted. She is not diaphoretic. No erythema. No pallor.  Psychiatric: She has a normal mood and affect. Her behavior is normal.    ED Course  Procedures (including critical care time)  DIAGNOSTIC STUDIES: Oxygen Saturation is 99% on room air, normal by my interpretation.    COORDINATION OF CARE: 9:29 PM -Patient was given breathing treatment and seems better. Will order Deltasone tablet. Patient verbalizes understanding and agrees with treatment plan.  Labs Review Labs Reviewed - No data to display Imaging Review No results found.  MDM   1. Asthma exacerbation    32 year old female with a history of asthma who presents for worsening shortness of breath x2 days. Patient well and nontoxic appearing, hemodynamically stable, and afebrile. Lungs clear to auscultation bilaterally with good inspiratory effort. No accessory muscle use or retractions. Patient states symptoms improved with DuoNeb treatment in ED. Also ordered for patient to receive prednisone. She is without tachypnea, dyspnea, or hypoxia; doubt pneumonia as patient is afebrile. She ambulates in the ED without hypoxia. Patient appropriate for discharge with primary care followup as needed. Prednisone burst as well as albuterol inhaler and nebulizer solution prescribed for symptoms. Return precautions discussed and patient agreeable to plan with no unaddressed concerns.    Antony Madura, PA-C 08/13/13 2143

## 2013-08-13 NOTE — ED Notes (Signed)
Pt has had trouble breathing/wheezing x 2 days. States she has not had trouble with asthma in years. Has some of her inhaler yesterday but ran out.

## 2013-08-13 NOTE — ED Provider Notes (Signed)
Medical screening examination/treatment/procedure(s) were performed by non-physician practitioner and as supervising physician I was immediately available for consultation/collaboration.     Imani Sherrin R Konstantina Nachreiner, MD 08/13/13 2356 

## 2015-06-23 ENCOUNTER — Encounter (HOSPITAL_COMMUNITY): Payer: Self-pay | Admitting: Adult Health

## 2015-06-23 ENCOUNTER — Emergency Department (HOSPITAL_COMMUNITY): Payer: BLUE CROSS/BLUE SHIELD

## 2015-06-23 ENCOUNTER — Emergency Department (HOSPITAL_COMMUNITY)
Admission: EM | Admit: 2015-06-23 | Discharge: 2015-06-24 | Disposition: A | Discharge status: Home / Self Care | Payer: BLUE CROSS/BLUE SHIELD | Attending: Emergency Medicine | Admitting: Emergency Medicine

## 2015-06-23 DIAGNOSIS — R110 Nausea: Secondary | ICD-10-CM | POA: Insufficient documentation

## 2015-06-23 DIAGNOSIS — R0602 Shortness of breath: Secondary | ICD-10-CM

## 2015-06-23 DIAGNOSIS — J4531 Mild persistent asthma with (acute) exacerbation: Secondary | ICD-10-CM

## 2015-06-23 DIAGNOSIS — Z79899 Other long term (current) drug therapy: Secondary | ICD-10-CM | POA: Diagnosis not present

## 2015-06-23 DIAGNOSIS — J4521 Mild intermittent asthma with (acute) exacerbation: Secondary | ICD-10-CM | POA: Insufficient documentation

## 2015-06-23 MED ORDER — MAGNESIUM SULFATE 2 GM/50ML IV SOLN
2.0000 g | INTRAVENOUS | Status: AC
  Administered 2015-06-24: 2 g via INTRAVENOUS
  Filled 2015-06-23: qty 50

## 2015-06-23 MED ORDER — ALBUTEROL SULFATE (2.5 MG/3ML) 0.083% IN NEBU
5.0000 mg | INHALATION_SOLUTION | Freq: Once | RESPIRATORY_TRACT | Status: AC
  Administered 2015-06-23: 2.5 mg via RESPIRATORY_TRACT
  Filled 2015-06-23: qty 6

## 2015-06-23 MED ORDER — SODIUM CHLORIDE 0.9 % IV BOLUS (SEPSIS)
1000.0000 mL | Freq: Once | INTRAVENOUS | Status: AC
  Administered 2015-06-24: 1000 mL via INTRAVENOUS

## 2015-06-23 MED ORDER — IPRATROPIUM-ALBUTEROL 0.5-2.5 (3) MG/3ML IN SOLN
3.0000 mL | Freq: Once | RESPIRATORY_TRACT | Status: AC
  Administered 2015-06-23: 3 mL via RESPIRATORY_TRACT

## 2015-06-23 MED ORDER — ALBUTEROL (5 MG/ML) CONTINUOUS INHALATION SOLN
10.0000 mg/h | INHALATION_SOLUTION | Freq: Once | RESPIRATORY_TRACT | Status: AC
  Administered 2015-06-24: 10 mg/h via RESPIRATORY_TRACT
  Filled 2015-06-23: qty 20

## 2015-06-23 MED ORDER — METHYLPREDNISOLONE SODIUM SUCC 125 MG IJ SOLR
125.0000 mg | Freq: Once | INTRAMUSCULAR | Status: AC
  Administered 2015-06-24: 125 mg via INTRAVENOUS
  Filled 2015-06-23: qty 2

## 2015-06-23 MED ORDER — IPRATROPIUM BROMIDE 0.02 % IN SOLN
0.5000 mg | Freq: Once | RESPIRATORY_TRACT | Status: AC
  Administered 2015-06-24: 0.5 mg via RESPIRATORY_TRACT
  Filled 2015-06-23: qty 2.5

## 2015-06-23 NOTE — ED Provider Notes (Signed)
CSN: [id number]     Arrival date & time 06/23/15  2311 History   First MD Initiated Contact with Patient 06/23/15 2338     Chief Complaint  Patient presents with  . Shortness of Breath    (Consider location/radiation/quality/duration/timing/severity/associated sxs/prior Treatment) HPI Comments: Patient is a 34 year old female with a history of asthma who presents to the emergency department for further evaluation of shortness of breath and chest tightness. Patient states that her symptoms have been worsening over the past few days. She reports coughing; cough is mostly dry, though she states that she feels as though she has been coughing up a scant amount of blood recently. She notes wheezing which has not resolved after albuterol nebulizer treatment at home. She has felt nauseous at times with some dry heaving, but denies any productive emesis. She was treated 3 weeks ago for a respiratory infection with Levaquin which she took for 5 days. 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 Patient denies any fever, syncope, abdominal pain, or hematemesis.   Patient is a 34 y.o. female presenting with shortness of breath. The history is provided by the patient. No language interpreter was used.  Shortness of Breath Associated symptoms: chest pain and wheezing   Associated symptoms: no fever     Past Medical History  Diagnosis Date  . Asthma    History reviewed. No pertinent past surgical history. History reviewed. No pertinent family history. Social History  Substance Use Topics  . Smoking status: None  . Smokeless tobacco: None  . Alcohol Use: None   OB History    No data available      Review of Systems  Constitutional: Negative for fever.  Respiratory: Positive for chest tightness, shortness of breath and wheezing.   Cardiovascular: Positive for chest pain.  Gastrointestinal: Positive for nausea.  Neurological: Negative for syncope.   All other systems reviewed and are negative.   Allergies  Review of patient's allergies indicates no known allergies.  Home Medications   Prior to Admission medications   Medication Sig Start Date End Date Taking? Authorizing Provider  albuterol (PROVENTIL HFA;VENTOLIN HFA) 108 (90 BASE) MCG/ACT inhaler Inhale 2 puffs into the lungs every 6 (six) hours as needed for wheezing.   Yes Historical Provider, MD  albuterol (PROVENTIL) (2.5 MG/3ML) 0.083% nebulizer solution Take 3 mLs (2.5 mg total) by nebulization every 6 (six) hours as needed for wheezing. 08/13/13  Yes Antonietta Breach, PA-C  folic acid (FOLVITE) 1 MG tablet Take 1 tablet by mouth daily. 06/13/15  Yes Historical Provider, MD  montelukast (SINGULAIR) 10 MG tablet Take 1 tablet by mouth daily. 05/30/15  Yes Historical Provider, MD  RECLIPSEN 0.15-30 MG-MCG tablet Take 1 tablet by mouth daily. 05/30/15  Yes Historical Provider, MD  predniSONE (DELTASONE) 20 MG tablet Take 2 tablets (40 mg total) by mouth daily. Patient not taking: Reported on 06/24/2015 08/13/13   Antonietta Breach, PA-C   BP 136/86 mmHg  Pulse 72  Temp(Src) 98 F (36.7 C) (Oral)  Resp 16  SpO2 100%  LMP 06/05/2015 (Approximate)   Physical Exam  Constitutional: She is oriented to person, place, and time. She appears well-developed and well-nourished. No distress.  Nontoxic/nonseptic appearing  HENT:  Head: Normocephalic and atraumatic.  Eyes: Conjunctivae and EOM are normal. No scleral icterus.  Neck: Normal range of motion.  Cardiovascular: Normal rate, regular rhythm and intact distal pulses.   Pulmonary/Chest: Effort normal. No respiratory distress. She has wheezes. She  has no rales.  Patient speaking in mild truncated sentences. She has expiratory wheezing diffusely. Mild accessory muscle use with tachypnea.  Musculoskeletal: Normal range of motion.  Neurological: She is alert and oriented to person, place, and time. She exhibits normal muscle tone. Coordination  normal.  Skin: Skin is warm and dry. No rash noted. She is not diaphoretic. No erythema. No pallor.  Psychiatric: She has a normal mood and affect. Her behavior is normal.  Nursing note and vitals reviewed.   ED Course  Procedures (including critical care time) Labs Review Labs Reviewed  CBC WITH DIFFERENTIAL/PLATELET - Abnormal; Notable for the following:    Lymphs Abs 4.3 (*)    All other components within normal limits  BASIC METABOLIC PANEL    Imaging Review No results found.   EKG Interpretation   Date/Time:  Wednesday June 23 2015 23:23:23 EDT Ventricular Rate:  89 PR Interval:    QRS Duration: 79 QT Interval:  361 QTC Calculation: 439 R Axis:   71 Text Interpretation:  Normal sinus rhythm Baseline wander in lead(s) V1 V2  Electrode noise No old tracing to compare Confirmed by KNAPP  MD-I, IVA  ([id number]) on 06/23/2015 11:54:08 PM      Medications  methylPREDNISolone sodium succinate (SOLU-MEDROL) 125 mg/2 mL injection 125 mg (not administered)  sodium chloride 0.9 % bolus 1,000 mL (not administered)  magnesium sulfate IVPB 2 g 50 mL (not administered)  albuterol (PROVENTIL) (2.5 MG/3ML) 0.083% nebulizer solution 5 mg (2.5 mg Nebulization Given 06/23/15 2328)  ipratropium-albuterol (DUONEB) 0.5-2.5 (3) MG/3ML nebulizer solution 3 mL (3 mLs Nebulization Given 06/23/15 2328)  albuterol (PROVENTIL,VENTOLIN) solution continuous neb (10 mg/hr Nebulization Given 06/24/15 0028)  ipratropium (ATROVENT) nebulizer solution 0.5 mg (0.5 mg Nebulization Given 06/24/15 0027)    CRITICAL CARE Performed by: Antonietta Breach   Total critical care time: 31  Critical care time was exclusive of separately billable procedures and treating other patients.  Critical care was necessary to treat or prevent imminent or life-threatening deterioration.  Critical care was time spent personally by me on the following activities: development of treatment plan with patient and/or surrogate as well  as nursing, discussions with consultants, evaluation of patient's response to treatment, examination of patient, obtaining history from patient or surrogate, ordering and performing treatments and interventions, ordering and review of laboratory studies, ordering and review of radiographic studies, pulse oximetry and re-evaluation of patient's condition.  MDM   Final diagnoses:  Shortness of breath    34 y/o female presents to the ED for SOB and chest tightness with wheezing. Hx of asthma. She reports recently being treated for a "respiratory infection" with Levaquin 3 weeks ago. She has had no relief of her symptoms with her albuterol at home. Patient still with diffuse wheezing on exam after DuoNeb. Continuous albuterol, Solu-Medrol, and MgSO4 ordered with IVF for management. Labs and CXR added.  No fever or leukocytosis; doubt PNA, but Xray will evaluate for focal consolidation. Patient to be rechecked by Charlann Lange, PA-C to whom sign out was given at change of shift. Upstill, PA-C to disposition appropriately. Patient may require admission if wheezing/SOB persists despite CAT treatments or if hypoxia develops.   Filed Vitals:   06/23/15 2318 06/24/15 0030  BP: 136/86   Pulse: 100 72  Temp: 98 F (36.7 C)   TempSrc: Oral   Resp: 28 16  SpO2: 100% 100%     Antonietta Breach, PA-C 06/24/15 3016  Rolland Porter, MD 06/24/15 [phone]

## 2015-06-23 NOTE — ED Notes (Signed)
Presents with bilateral expiratory and inspiratory wheezes, able to speak in short phrases, sats 100% on RA, denies productive cough-recently treated for respiratory infection and given levaquiin for 5 days/. REports having chest pain and tasting blood.

## 2015-06-24 LAB — CBC WITH DIFFERENTIAL/PLATELET
Basophils Absolute: 0 10*3/uL (ref 0.0–0.1)
Basophils Relative: 0 % (ref 0–1)
Eosinophils Absolute: 0.5 10*3/uL (ref 0.0–0.7)
Eosinophils Relative: 5 % (ref 0–5)
HEMATOCRIT: 38.7 % (ref 36.0–46.0)
HEMOGLOBIN: 12.9 g/dL (ref 12.0–15.0)
LYMPHS PCT: 42 % (ref 12–46)
Lymphs Abs: 4.3 10*3/uL — ABNORMAL HIGH (ref 0.7–4.0)
MCH: 31.5 pg (ref 26.0–34.0)
MCHC: 33.3 g/dL (ref 30.0–36.0)
MCV: 94.4 fL (ref 78.0–100.0)
MONO ABS: 0.6 10*3/uL (ref 0.1–1.0)
Monocytes Relative: 5 % (ref 3–12)
Neutro Abs: 5 10*3/uL (ref 1.7–7.7)
Neutrophils Relative %: 48 % (ref 43–77)
PLATELETS: 393 10*3/uL (ref 150–400)
RBC: 4.1 MIL/uL (ref 3.87–5.11)
RDW: 12.4 % (ref 11.5–15.5)
WBC: 10.4 10*3/uL (ref 4.0–10.5)

## 2015-06-24 LAB — BASIC METABOLIC PANEL
ANION GAP: 10 (ref 5–15)
BUN: 11 mg/dL (ref 6–20)
CO2: 19 mmol/L — ABNORMAL LOW (ref 22–32)
CREATININE: 1.02 mg/dL — AB (ref 0.44–1.00)
Calcium: 9.2 mg/dL (ref 8.9–10.3)
Chloride: 110 mmol/L (ref 101–111)
Glucose, Bld: 74 mg/dL (ref 65–99)
Potassium: 3.7 mmol/L (ref 3.5–5.1)
Sodium: 139 mmol/L (ref 135–145)

## 2015-06-24 MED ORDER — PREDNISONE 10 MG PO TABS: ORAL_TABLET | ORAL | Status: DC

## 2015-06-24 MED ORDER — ALBUTEROL SULFATE (2.5 MG/3ML) 0.083% IN NEBU: 5.0000 mg | INHALATION_SOLUTION | Freq: Four times a day (QID) | RESPIRATORY_TRACT | Status: DC | PRN

## 2015-06-24 NOTE — Discharge Instructions (Signed)
Asthma Asthma is a recurring condition in which the airways tighten and narrow. Asthma can make it difficult to breathe. It can cause coughing, wheezing, and shortness of breath. Asthma episodes, also called asthma attacks, range from minor to life-threatening. Asthma cannot be cured, but medicines and lifestyle changes can help control it. CAUSES Asthma is believed to be caused by inherited (genetic) and environmental factors, but its exact cause is unknown. Asthma may be triggered by allergens, lung infections, or irritants in the air. Asthma triggers are different for each person. Common triggers include:   Animal dander.  Dust mites.  Cockroaches.  Pollen from trees or grass.  Mold.  Smoke.  Air pollutants such as dust, household cleaners, hair sprays, aerosol sprays, paint fumes, strong chemicals, or strong odors.  Cold air, weather changes, and winds (which increase molds and pollens in the air).  Strong emotional expressions such as crying or laughing hard.  Stress.  Certain medicines (such as aspirin) or types of drugs (such as beta-blockers).  Sulfites in foods and drinks. Foods and drinks that may contain sulfites include dried fruit, potato chips, and sparkling grape juice.  Infections or inflammatory conditions such as the flu, a cold, or an inflammation of the nasal membranes (rhinitis).  Gastroesophageal reflux disease (GERD).  Exercise or strenuous activity. SYMPTOMS Symptoms may occur immediately after asthma is triggered or many hours later. Symptoms include:  Wheezing.  Excessive nighttime or early morning coughing.  Frequent or severe coughing with a common cold.  Chest tightness.  Shortness of breath. DIAGNOSIS  The diagnosis of asthma is made by a review of your medical history and a physical exam. Tests may also be performed. These may include:  Lung function studies. These tests show how much air you breathe in and out.  Allergy  tests.  Imaging tests such as X-rays. TREATMENT  Asthma cannot be cured, but it can usually be controlled. Treatment involves identifying and avoiding your asthma triggers. It also involves medicines. There are 2 classes of medicine used for asthma treatment:   Controller medicines. These prevent asthma symptoms from occurring. They are usually taken every day.  Reliever or rescue medicines. These quickly relieve asthma symptoms. They are used as needed and provide short-term relief. Your health care provider will help you create an asthma action plan. An asthma action plan is a written plan for managing and treating your asthma attacks. It includes a list of your asthma triggers and how they may be avoided. It also includes information on when medicines should be taken and when their dosage should be changed. An action plan may also involve the use of a device called a peak flow meter. A peak flow meter measures how well the lungs are working. It helps you monitor your condition. HOME CARE INSTRUCTIONS   Take medicines only as directed by your health care provider. Speak with your health care provider if you have questions about how or when to take the medicines.  Use a peak flow meter as directed by your health care provider. Record and keep track of readings.  Understand and use the action plan to help minimize or stop an asthma attack without needing to seek medical care.  Control your home environment in the following ways to help prevent asthma attacks:  Do not smoke. Avoid being exposed to secondhand smoke.  Change your heating and air conditioning filter regularly.  Limit your use of fireplaces and wood stoves.  Get rid of pests (such as roaches and  mice) and their droppings.  Throw away plants if you see mold on them.  Clean your floors and dust regularly. Use unscented cleaning products.  Try to have someone else vacuum for you regularly. Stay out of rooms while they are  being vacuumed and for a short while afterward. If you vacuum, use a dust mask from a hardware store, a double-layered or microfilter vacuum cleaner bag, or a vacuum cleaner with a HEPA filter.  Replace carpet with wood, tile, or vinyl flooring. Carpet can trap dander and dust.  Use allergy-proof pillows, mattress covers, and box spring covers.  Wash bed sheets and blankets every week in hot water and dry them in a dryer.  Use blankets that are made of polyester or cotton.  Clean bathrooms and kitchens with bleach. If possible, have someone repaint the walls in these rooms with mold-resistant paint. Keep out of the rooms that are being cleaned and painted.  Wash hands frequently. SEEK MEDICAL CARE IF:   You have wheezing, shortness of breath, or a cough even if taking medicine to prevent attacks.  The colored mucus you cough up (sputum) is thicker than usual.  Your sputum changes from clear or white to yellow, green, gray, or bloody.  You have any problems that may be related to the medicines you are taking (such as a rash, itching, swelling, or trouble breathing).  You are using a reliever medicine more than 2-3 times per week.  Your peak flow is still at 50-79% of your personal best after following your action plan for 1 hour.  You have a fever. SEEK IMMEDIATE MEDICAL CARE IF:   You seem to be getting worse and are unresponsive to treatment during an asthma attack.  You are short of breath even at rest.  You get short of breath when doing very little physical activity.  You have difficulty eating, drinking, or talking due to asthma symptoms.  You develop chest pain.  You develop a fast heartbeat.  You have a bluish color to your lips or fingernails.  You are light-headed, dizzy, or faint.  Your peak flow is less than 50% of your personal best. MAKE SURE YOU:   Understand these instructions.  Will watch your condition.  Will get help right away if you are not  doing well or get worse. Document Released: 10/30/2005 Document Revised: 03/16/2014 Document Reviewed: 05/29/2013 [facility] Patient Information 2015 [place], [place]. This information is not intended to replace advice given to you by your health care provider. Make sure you discuss any questions you have with your health care provider.  Asthma Attack Prevention Although there is no way to prevent asthma from starting, you can take steps to control the disease and reduce its symptoms. Learn about your asthma and how to control it. Take an active role to control your asthma by working with your health care provider to create and follow an asthma action plan. An asthma action plan guides you in:  Taking your medicines properly.  Avoiding things that set off your asthma or make your asthma worse (asthma triggers).  Tracking your level of asthma control.  Responding to worsening asthma.  Seeking emergency care when needed. To track your asthma, keep records of your symptoms, check your peak flow number using a handheld device that shows how well air moves out of your lungs (peak flow meter), and get regular asthma checkups.  WHAT ARE SOME WAYS TO PREVENT AN ASTHMA ATTACK?  Take medicines as directed by your health  care provider.  Keep track of your asthma symptoms and level of control.  With your health care provider, write a detailed plan for taking medicines and managing an asthma attack. Then be sure to follow your action plan. Asthma is an ongoing condition that needs regular monitoring and treatment.  Identify and avoid asthma triggers. Many outdoor allergens and irritants (such as pollen, mold, cold air, and air pollution) can trigger asthma attacks. Find out what your asthma triggers are and take steps to avoid them.  Monitor your breathing. Learn to recognize warning signs of an attack, such as coughing, wheezing, or shortness of breath. Your lung function may decrease before you notice  any signs or symptoms, so regularly measure and record your peak airflow with a home peak flow meter.  Identify and treat attacks early. If you act quickly, you are less likely to have a severe attack. You will also need less medicine to control your symptoms. When your peak flow measurements decrease and alert you to an upcoming attack, take your medicine as instructed and immediately stop any activity that may have triggered the attack. If your symptoms do not improve, get medical help.  Pay attention to increasing quick-relief inhaler use. If you find yourself relying on your quick-relief inhaler, your asthma is not under control. See your health care provider about adjusting your treatment. WHAT CAN MAKE MY SYMPTOMS WORSE? A number of common things can set off or make your asthma symptoms worse and cause temporary increased inflammation of your airways. Keep track of your asthma symptoms for several weeks, detailing all the environmental and emotional factors that are linked with your asthma. When you have an asthma attack, go back to your asthma diary to see which factor, or combination of factors, might have contributed to it. Once you know what these factors are, you can take steps to control many of them. If you have allergies and asthma, it is important to take asthma prevention steps at home. Minimizing contact with the substance to which you are allergic will help prevent an asthma attack. Some triggers and ways to avoid these triggers are: Animal Dander:  Some people are allergic to the flakes of skin or dried saliva from animals with fur or feathers.   There is no such thing as a hypoallergenic dog or cat breed. All dogs or cats can cause allergies, even if they don't shed.  Keep these pets out of your home.  If you are not able to keep a pet outdoors, keep the pet out of your bedroom and other sleeping areas at all times, and keep the door closed.  Remove carpets and furniture covered  with cloth from your home. If that is not possible, keep the pet away from fabric-covered furniture and carpets. Dust Mites: Many people with asthma are allergic to dust mites. Dust mites are tiny bugs that are found in every home in mattresses, pillows, carpets, fabric-covered furniture, bedcovers, clothes, stuffed toys, and other fabric-covered items.   Cover your mattress in a special dust-proof cover.  Cover your pillow in a special dust-proof cover, or wash the pillow each week in hot water. Water must be hotter than 130 F (54.4 C) to kill dust mites. Cold or warm water used with detergent and bleach can also be effective.  Wash the sheets and blankets on your bed each week in hot water.  Try not to sleep or lie on cloth-covered cushions.  Call ahead when traveling and ask for a smoke-free  hotel room. Bring your own bedding and pillows in case the hotel only supplies feather pillows and down comforters, which may contain dust mites and cause asthma symptoms.  Remove carpets from your bedroom and those laid on concrete, if you can.  Keep stuffed toys out of the bed, or wash the toys weekly in hot water or cooler water with detergent and bleach. Cockroaches: Many people with asthma are allergic to the droppings and remains of cockroaches.   Keep food and garbage in closed containers. Never leave food out.  Use poison baits, traps, powders, gels, or paste (for example, boric acid).  If a spray is used to kill cockroaches, stay out of the room until the odor goes away. Indoor Mold:  Fix leaky faucets, pipes, or other sources of water that have mold around them.  Clean floors and moldy surfaces with a fungicide or diluted bleach.  Avoid using humidifiers, vaporizers, or swamp coolers. These can spread molds through the air. Pollen and Outdoor Mold:  When pollen or mold spore counts are high, try to keep your windows closed.  Stay indoors with windows closed from late morning to  afternoon. Pollen and some mold spore counts are highest at that time.  Ask your health care provider whether you need to take anti-inflammatory medicine or increase your dose of the medicine before your allergy season starts. Other Irritants to Avoid:  Tobacco smoke is an irritant. If you smoke, ask your health care provider how you can quit. Ask family members to quit smoking, too. Do not allow smoking in your home or car.  If possible, do not use a wood-burning stove, kerosene heater, or fireplace. Minimize exposure to all sources of smoke, including incense, candles, fires, and fireworks.  Try to stay away from strong odors and sprays, such as perfume, talcum powder, hair spray, and paints.  Decrease humidity in your home and use an indoor air cleaning device. Reduce indoor humidity to below 60%. Dehumidifiers or central air conditioners can do this.  Decrease house dust exposure by changing furnace and air cooler filters frequently.  Try to have someone else vacuum for you once or twice a week. Stay out of rooms while they are being vacuumed and for a short while afterward.  If you vacuum, use a dust mask from a hardware store, a double-layered or microfilter vacuum cleaner bag, or a vacuum cleaner with a HEPA filter.  Sulfites in foods and beverages can be irritants. Do not drink beer or wine or eat dried fruit, processed potatoes, or shrimp if they cause asthma symptoms.  Cold air can trigger an asthma attack. Cover your nose and mouth with a scarf on cold or windy days.  Several health conditions can make asthma more difficult to manage, including a runny nose, sinus infections, reflux disease, psychological stress, and sleep apnea. Work with your health care provider to manage these conditions.  Avoid close contact with people who have a respiratory infection such as a cold or the flu, since your asthma symptoms may get worse if you catch the infection. Wash your hands thoroughly  after touching items that may have been handled by people with a respiratory infection.  Get a flu shot every year to protect against the flu virus, which often makes asthma worse for days or weeks. Also get a pneumonia shot if you have not previously had one. Unlike the flu shot, the pneumonia shot does not need to be given yearly. Medicines:  Talk to your health  care provider about whether it is safe for you to take aspirin or non-steroidal anti-inflammatory medicines (NSAIDs). In a small number of people with asthma, aspirin and NSAIDs can cause asthma attacks. These medicines must be avoided by people who have known aspirin-sensitive asthma. It is important that people with aspirin-sensitive asthma read labels of all over-the-counter medicines used to treat pain, colds, coughs, and fever.  Beta-blockers and ACE inhibitors are other medicines you should discuss with your health care provider. HOW CAN I FIND OUT WHAT I AM ALLERGIC TO? Ask your asthma health care provider about allergy skin testing or blood testing (the RAST test) to identify the allergens to which you are sensitive. If you are found to have allergies, the most important thing to do is to try to avoid exposure to any allergens that you are sensitive to as much as possible. Other treatments for allergies, such as medicines and allergy shots (immunotherapy) are available.  CAN I EXERCISE? Follow your health care provider's advice regarding asthma treatment before exercising. It is important to maintain a regular exercise program, but vigorous exercise or exercise in cold, humid, or dry environments can cause asthma attacks, especially for those people who have exercise-induced asthma. Document Released: 10/18/2009 Document Revised: 11/04/2013 Document Reviewed: 05/07/2013 [facility] Patient Information 2015 [place], [place]. This information is not intended to replace advice given to you by your health care provider. Make sure you discuss  any questions you have with your health care provider.

## 2015-06-24 NOTE — ED Provider Notes (Signed)
Wheezing now Levaquin 3 weeks ago Steroids, duonebs, mag, CXR  Plan: re-evaluate after continuous neb; d/c home, +/- abx, steroid taper, zyrtec, PCP f/u  Evaluated after duoneb. She is feeling much better and feels better from a respiratory standpoint. Minimal end expiratory wheezing on auscultation. She ambulates without desaturation. Will discharge home per plan discussed with previous provider.   Charlann Lange, PA-C 06/25/15 [place], MD 06/26/15 [phone]

## 2015-07-29 ENCOUNTER — Encounter: Payer: Self-pay | Admitting: Internal Medicine

## 2015-07-29 ENCOUNTER — Ambulatory Visit (INDEPENDENT_AMBULATORY_CARE_PROVIDER_SITE_OTHER): Payer: BLUE CROSS/BLUE SHIELD | Admitting: Internal Medicine

## 2015-07-29 VITALS — BP 104/64 | HR 77 | Ht 63.0 in | Wt 198.8 lb

## 2015-07-29 DIAGNOSIS — E669 Obesity, unspecified: Secondary | ICD-10-CM

## 2015-07-29 DIAGNOSIS — J4540 Moderate persistent asthma, uncomplicated: Secondary | ICD-10-CM

## 2015-07-29 MED ORDER — PREDNISONE 10 MG PO TABS: ORAL_TABLET | ORAL | Status: DC

## 2015-07-29 MED ORDER — MOMETASONE FURO-FORMOTEROL FUM 200-5 MCG/ACT IN AERO: INHALATION_SPRAY | RESPIRATORY_TRACT | Status: DC

## 2015-07-29 NOTE — Assessment & Plan Note (Signed)
Body mass index is 35.22 kg/(m^2).  Lab Results  Component Value Date   TSH 1.132 03/02/2009     Contributing to gerd tendency/ doe/reviewed need  achieve and maintain neg calorie balance > defer f/u primary care including intermittently monitoring thyroid status

## 2015-07-29 NOTE — Progress Notes (Signed)
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 Pulmonary Consult    Self referral. Pt states dxed with Asthma as a child. She c/o increased SOB and wheezing for the past 4 months. She states that her breathing is esp worse first thing in the am and in the evening. She has also had some non prod cough and CP off and on. She is using proventil hfa inhaler 3 x daily on average and albuterol neb 4-6 x per day.   present on awakening each am uses neb immediately on rising except nothing on day of ov Nasal congestion/ mild wheeze/ lower midline cp  pred improved a bunch but didn't last beyond mid August 2016   No obvious other patterns in day to day or daytime variabilty or assocor cp or chest tightness,  overt sinus or hb symptoms. No unusual exp hx or h/o childhood pna/ asthma or knowledge of premature birth.  Sleeping ok without nocturnal  or early am exacerbation  of respiratory  c/o's or need for noct saba. Also denies any obvious fluctuation of symptoms with weather or environmental changes or other aggravating or alleviating factors except as outlined above   Current Medications, Allergies, Complete Past Medical History, Past Surgical History, Family History, and Social History were reviewed in [employer] record.              Review of Systems  Constitutional: Negative for fever,  chills and unexpected weight change.  HENT: Positive for congestion and sore throat. Negative for dental problem, ear pain, nosebleeds, postnasal drip, rhinorrhea, sinus pressure, sneezing, trouble swallowing and voice change.   Eyes: Negative for visual disturbance.  Respiratory: Positive for cough and shortness of breath. Negative for choking.   Cardiovascular: Positive for chest pain. Negative for leg swelling.  Gastrointestinal: Negative for vomiting, abdominal pain and diarrhea.  Genitourinary: Negative for difficulty urinating.  Musculoskeletal: Negative for arthralgias.  Skin: Negative for rash.  Neurological: Positive for headaches. Negative for tremors and syncope.  Hematological: Does not bruise/bleed easily.       Objective:   Physical Exam  amb bf nad  Wt Readings from Last 3 Encounters:  07/29/15 198 lb 12.8 oz (90.175 kg)  02/22/10 165 lb (74.844 kg)  12/23/09 169 lb (76.658 kg)    Vital signs reviewed  HEENT: nl dentition, turbinates, and orophanx. Nl external ear canals without cough reflex   NECK :  without JVD/Nodes/TM/ nl carotid upstrokes bilaterally   LUNGS: no acc muscle use, insp and exp rhonchi    CV:  RRR  no s3 or murmur or increase in P2, no edema   ABD:  soft and nontender with nl excursion in the supine position. No bruits or organomegaly, bowel sounds nl  MS:  warm without deformities, calf tenderness, cyanosis or clubbing  SKIN: warm and dry without lesions    NEURO:  alert, approp, no deficits      I personally reviewed images and agree with radiology impression as follows:  CXR:   06/23/15 wnl        Assessment & Plan:

## 2015-07-29 NOTE — Patient Instructions (Addendum)
Plan A = automatic = dulera 200 Take 2 puffs first thing in am and then another 2 puffs about 12 hours later.   Plan B = backup Only use your albuterol as a rescue medication to be used if you can't catch your breath by resting or doing a relaxed purse lip breathing pattern.  - The less you use it, the better it will work when you need it. - Ok to use up to 2 puffs  every 4 hours if you must but call for immediate appointment if use goes up over your usual need - Don't leave home without it !!  (think of it like the spare tire for your car)   Prednisone 10 mg take  4 each am x 2 days,   2 each am x 2 days,  1 each am x 2 days and stop  Please schedule a follow up office visit in 2 weeks, sooner if needed  - bring all inhalers with you

## 2015-07-29 NOTE — Assessment & Plan Note (Signed)
DDX of  difficult airways management all start with A and  include Adherence, Ace Inhibitors, Acid Reflux, Active Sinus Disease, Alpha 1 Antitripsin deficiency, Anxiety masquerading as Airways dz,  ABPA,  allergy(esp in young), Aspiration (esp in elderly), Adverse effects of meds,  Active smokers, A bunch of PE's (a small clot burden can't cause this syndrome unless there is already severe underlying pulm or vascular dz with poor reserve) plus two Bs  = Bronchiectasis and Beta blocker use..and one C= CHF  In this case Adherence is the biggest issue and starts with  inability to use HFA effectively and also  understand that SABA treats the symptoms but doesn't get to the underlying problem (inflammation).  I used  the analogy of putting steroid cream on a rash to help explain the meaning of topical therapy and the need to get the drug to the target tissue.    The proper method of use, as well as anticipated side effects, of a metered-dose inhaler are discussed and demonstrated to the patient. Improved effectiveness after extensive coaching during this visit to a level of approximately  75% so try dulera 200 2bid  ? Allergy > neg resp to singulair/ Eos up on prev eval > needs allergy profile next ov    I had an extended discussion with the patient reviewing all relevant studies completed to date and  lasting 35n  Each maintenance medication was reviewed in detail including most importantly the difference between maintenance and prns and under what circumstances the prns are to be triggered using an action plan format that is not reflected in the computer generated alphabetically organized AVS.    Please see instructions for details which were reviewed in writing and the patient given a copy highlighting the part that I personally wrote and discussed at today's ov.

## 2015-08-16 ENCOUNTER — Encounter: Payer: Self-pay | Admitting: Internal Medicine

## 2015-08-16 ENCOUNTER — Ambulatory Visit (INDEPENDENT_AMBULATORY_CARE_PROVIDER_SITE_OTHER): Payer: BLUE CROSS/BLUE SHIELD | Admitting: Internal Medicine

## 2015-08-16 VITALS — BP 124/74 | HR 80 | Ht 63.0 in | Wt 200.0 lb

## 2015-08-16 DIAGNOSIS — J4540 Moderate persistent asthma, uncomplicated: Secondary | ICD-10-CM | POA: Diagnosis not present

## 2015-08-16 DIAGNOSIS — E669 Obesity, unspecified: Secondary | ICD-10-CM | POA: Diagnosis not present

## 2015-08-16 MED ORDER — MOMETASONE FURO-FORMOTEROL FUM 200-5 MCG/ACT IN AERO: INHALATION_SPRAY | RESPIRATORY_TRACT | Status: DC

## 2015-08-16 MED ORDER — MONTELUKAST SODIUM 10 MG PO TABS: 10.0000 mg | ORAL_TABLET | Freq: Every day | ORAL | Status: DC

## 2015-08-16 NOTE — Patient Instructions (Addendum)
Continue dulera 200 Take 2 puffs first thing in am and then another 2 puffs about 12 hours later.   If not 100% satisfied with nasal symptoms or breathing next add generic singulair 10 mg each pm  Let me know if any problems filling your prescription- we can always give you samples  Please schedule a follow up visit in 3 months but call sooner if needed  Late add:  Check tsh on return if has no primary doctor by then

## 2015-08-16 NOTE — Progress Notes (Signed)
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 Pulmonary Consult    Self referral. Pt states dxed with Asthma as a child. She c/o increased SOB and wheezing for the past 4 months. She states that her breathing is esp worse first thing in the am and in the evening. She has also had some non prod cough and CP off and on. She is using proventil hfa inhaler 3 x daily on average and albuterol neb 4-6 x per day.   present on awakening each am uses neb immediately on rising except nothing on day of ov Nasal congestion/ mild wheeze/ lower midline cp  pred improved a bunch but didn't last beyond mid August 2016  rec Plan A = automatic = dulera 200 Take 2 puffs first thing in am and then another 2 puffs about 12 hours later.  Plan B = backup Only use your albuterol as a rescue medication   Prednisone 10 mg take  4 each am x 2 days,   2 each am x 2 days,  1 each am x 2 days and stop Please schedule a follow up office visit in 2 weeks, sooner if needed  - bring all inhalers with you    08/16/2015  f/u ov/Montzerrat Brunell re: chronic asthma/ maint on dulera 200 2bid  Chief Complaint  Patient presents with  . Follow-up    Pt states her breathing is much improved. She is coughing less and has not had any more wheezing or CP. No new co's today. She has not had to use her albuterol inhaler or  neb.      still some residual nasal congestion   Not limited by breathing from desired activities    No obvious day to day or daytime variability or assoc chronic cough or cp or chest tightness, subjective wheeze or   hb symptoms. No unusual exp hx or h/o childhood pna/ asthma or knowledge of premature birth.  Sleeping ok without nocturnal  or early am exacerbation  of respiratory  c/o's or need for noct saba. Also denies any obvious fluctuation of symptoms with weather or environmental changes or other aggravating or alleviating factors except as outlined above   Current Medications, Allergies, Complete Past Medical History, Past Surgical History, Family History, and Social History were reviewed in [employer] record.  ROS  The following are not active complaints unless bolded sore throat, dysphagia, dental problems, itching, sneezing,  nasal congestion or excess/ purulent secretions, ear ache,   fever, chills, sweats, unintended wt loss, classically pleuritic or exertional cp, hemoptysis,  orthopnea pnd or leg swelling, presyncope, palpitations, abdominal pain, anorexia, nausea, vomiting, diarrhea  or change in bowel or bladder habits, change in stools or urine, dysuria,hematuria,  rash, arthralgias, visual complaints, headache, numbness, weakness  or ataxia or problems with walking or coordination,  change in mood/affect or memory.                      Objective:   Physical Exam  amb bf nad   .08/16/2015        200  Wt Readings from Last 3 Encounters:  07/29/15 198 lb 12.8 oz (90.175 kg)  02/22/10 165 lb (74.844 kg)  12/23/09 169 lb (76.658 kg)    Vital signs reviewed  HEENT: nl dentition, turbinates, and orophanx. Nl external ear canals without cough reflex   NECK :  without JVD/Nodes/TM/ nl carotid upstrokes bilaterally   LUNGS: no acc muscle use,  Clear now bilaterally to A and P    CV:  RRR  no s3 or murmur or increase in P2, no edema   ABD:   soft and nontender with nl excursion in the supine position. No bruits or organomegaly, bowel sounds nl  MS:  warm without deformities, calf tenderness, cyanosis or clubbing  SKIN: warm and dry without lesions    NEURO:  alert, approp, no deficits      I personally reviewed images and agree with radiology impression as follows:  CXR:   06/23/15 wnl        Assessment & Plan:

## 2015-08-22 NOTE — Assessment & Plan Note (Signed)
Body mass index is 35.44 kg/(m^2).  Lab Results  Component Value Date   TSH 1.132 03/02/2009     Contributing to gerd tendency/ doe/reviewed the need and the process to achieve and maintain neg calorie balance > defer f/u primary care including intermittently monitoring thyroid status

## 2015-08-22 NOTE — Assessment & Plan Note (Addendum)
-  Allergy profile 07/29/2015 >> did not go to lab - 08/16/2015  extensive coaching HFA effectiveness =   95%    All goals of chronic asthma control met including optimal function and elimination of symptoms with minimal need for rescue therapy.  Contingencies discussed in full including contacting this office immediately if not controlling the symptoms using the rule of two's.     Could add singulair 10 mg daily if not maintaining adequate control   Formulary restrictions will be an ongoing challenge for the forseable future and I would be happy to pick an alternative if the pt will first  provide me a list of them but pt  will need to return here for training for any new device that is required eg dpi vs hfa vs respimat.    In meantime we can always provide samples so the patient never runs out of any needed respiratory medications.   F/u q 3 m

## 2015-11-16 ENCOUNTER — Ambulatory Visit: Payer: BLUE CROSS/BLUE SHIELD | Admitting: Internal Medicine

## 2015-11-18 ENCOUNTER — Ambulatory Visit: Payer: BLUE CROSS/BLUE SHIELD | Admitting: Internal Medicine

## 2016-08-23 ENCOUNTER — Other Ambulatory Visit: Payer: Self-pay | Admitting: Internal Medicine

## 2016-12-14 ENCOUNTER — Other Ambulatory Visit: Payer: Self-pay | Admitting: Otolaryngology

## 2016-12-14 DIAGNOSIS — K118 Other diseases of salivary glands: Secondary | ICD-10-CM

## 2016-12-14 DIAGNOSIS — D110 Benign neoplasm of parotid gland: Principal | ICD-10-CM

## 2016-12-18 ENCOUNTER — Ambulatory Visit
Admission: RE | Admit: 2016-12-18 | Discharge: 2016-12-18 | Disposition: A | Discharge status: Home / Self Care | Payer: Managed Care, Other (non HMO) | Source: Ambulatory Visit | Attending: Otolaryngology | Admitting: Otolaryngology

## 2016-12-18 DIAGNOSIS — D110 Benign neoplasm of parotid gland: Principal | ICD-10-CM

## 2016-12-18 DIAGNOSIS — K118 Other diseases of salivary glands: Secondary | ICD-10-CM

## 2016-12-18 MED ORDER — IOPAMIDOL (ISOVUE-300) INJECTION 61%
75.0000 mL | Freq: Once | INTRAVENOUS | Status: AC | PRN
  Administered 2016-12-18: 75 mL via INTRAVENOUS

## 2017-03-19 ENCOUNTER — Ambulatory Visit (INDEPENDENT_AMBULATORY_CARE_PROVIDER_SITE_OTHER): Payer: Managed Care, Other (non HMO) | Admitting: Internal Medicine

## 2017-03-19 ENCOUNTER — Other Ambulatory Visit (INDEPENDENT_AMBULATORY_CARE_PROVIDER_SITE_OTHER): Payer: Managed Care, Other (non HMO)

## 2017-03-19 ENCOUNTER — Encounter: Payer: Self-pay | Admitting: Internal Medicine

## 2017-03-19 VITALS — BP 124/76 | HR 91 | Ht 63.0 in | Wt 219.4 lb

## 2017-03-19 DIAGNOSIS — J4540 Moderate persistent asthma, uncomplicated: Secondary | ICD-10-CM

## 2017-03-19 LAB — CBC WITH DIFFERENTIAL/PLATELET
BASOS PCT: 0.9 % (ref 0.0–3.0)
Basophils Absolute: 0.1 10*3/uL (ref 0.0–0.1)
Eosinophils Absolute: 0.3 10*3/uL (ref 0.0–0.7)
Eosinophils Relative: 3.8 % (ref 0.0–5.0)
HCT: 36.6 % (ref 36.0–46.0)
Hemoglobin: 11.9 g/dL — ABNORMAL LOW (ref 12.0–15.0)
LYMPHS ABS: 3 10*3/uL (ref 0.7–4.0)
Lymphocytes Relative: 37.8 % (ref 12.0–46.0)
MCHC: 32.6 g/dL (ref 30.0–36.0)
MCV: 94.7 fl (ref 78.0–100.0)
Monocytes Absolute: 0.6 10*3/uL (ref 0.1–1.0)
Monocytes Relative: 7.4 % (ref 3.0–12.0)
NEUTROS ABS: 3.9 10*3/uL (ref 1.4–7.7)
NEUTROS PCT: 50.1 % (ref 43.0–77.0)
Platelets: 367 10*3/uL (ref 150.0–400.0)
RBC: 3.86 Mil/uL — ABNORMAL LOW (ref 3.87–5.11)
RDW: 13.1 % (ref 11.5–15.5)
WBC: 7.8 10*3/uL (ref 4.0–10.5)

## 2017-03-19 MED ORDER — BUDESONIDE-FORMOTEROL FUMARATE 80-4.5 MCG/ACT IN AERO: 2.0000 | INHALATION_SPRAY | Freq: Two times a day (BID) | RESPIRATORY_TRACT | 0 refills | Status: DC

## 2017-03-19 NOTE — Progress Notes (Signed)
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 Pulmonary Consult    Self referral. Pt states dxed with Asthma as a child. She c/o increased SOB and wheezing for the past 4 months. She states that her breathing is esp worse first thing in the am and in the evening. She has also had some non prod cough and CP off and on. She is using proventil hfa inhaler 3 x daily on average and albuterol neb 4-6 x per day.   present on awakening each am uses neb immediately on rising except nothing on day of ov Nasal congestion/ mild wheeze/ lower midline cp  pred improved a bunch but didn't last beyond mid August 2016  rec Plan A = automatic = dulera 200 Take 2 puffs first thing in am and then another 2 puffs about 12 hours later.  Plan B = backup Only use your albuterol as a rescue medication   Prednisone 10 mg take  4 each am x 2 days,   2 each am x 2 days,  1 each am x 2 days and stop Please schedule a follow up office visit in 2 weeks, sooner if needed  - bring all inhalers with you    08/16/2015  f/u ov/Bentley Fissel re: chronic asthma/ maint on dulera 200 2bid  Chief Complaint  Patient presents with  . Follow-up    Pt states her breathing is much improved. She is coughing less and has not had any more wheezing or CP. No new co's today. She has not had to use her  albuterol inhaler or neb.    still some residual nasal congestion  rec Continue dulera 200 Take 2 puffs first thing in am and then another 2 puffs about 12 hours later.  If not 100% satisfied with nasal symptoms or breathing next add generic singulair 10 mg each pm Let me know if any problems filling your prescription- we can always give you samples Please schedule a follow up visit in 3 months but call sooner if needed  > did not do      03/19/2017 acute extended ov/Kobyn Kray re: chronic asthma / maint singulair  Chief Complaint  Patient presents with  . Acute Visit    Increased wheezing, cough and SOB for the past month.  She is using her albuterol inhaler at least once per day, and has not needed neb.   out of dulera x months / rare need for albuterol while being maintained on singulair but x 4 weeks needing albtuerol up to once a day around noon to control wheeze/ dry cough and sob assoc with increased nasal congestion with the spring weather typical for her though thinks singulair helping nasal symptoms vs baseline  No obvious day to day  or daytime variability or assoc excess/ purulent sputum or mucus plugs or hemoptysis or cp or chest tightness,   or overt   hb symptoms. No unusual exp hx or h/o childhood pna/ asthma or knowledge of premature birth.  Sleeping ok without nocturnal  or early am exacerbation  of respiratory  c/o's or need for noct saba. Also denies any obvious fluctuation of symptoms with weather or environmental changes or other aggravating or alleviating factors except as outlined above   Current Medications, Allergies, Complete Past Medical History, Past Surgical History, Family History, and Social History were reviewed in [employer] record.  ROS  The following are not active complaints unless bolded sore throat, dysphagia, dental problems, itching, sneezing,  nasal congestion or excess/ purulent secretions, ear ache,   fever, chills, sweats,  unintended wt loss, classically pleuritic or exertional cp,  orthopnea pnd or leg swelling, presyncope, palpitations, abdominal pain, anorexia, nausea, vomiting, diarrhea  or change in bowel or bladder habits, change in stools or urine, dysuria,hematuria,  rash, arthralgias, visual complaints, headache, numbness, weakness or ataxia or problems with walking or coordination,  change in mood/affect or memory.                              Objective:   Physical Exam  amb bf nad   .08/16/2015        200 > 03/19/2017    220     07/29/15 198 lb 12.8 oz (90.175 kg)  02/22/10 165 lb (74.844 kg)  12/23/09 169 lb (76.658 kg)    Vital signs reviewed - Note on arrival 02 sats  98% on RA      HEENT: nl dentition,  , and orophanx. Nl external ear canals without cough reflex - mild to moderate bilateral non-specific turbinate edema     NECK :  without JVD/Nodes/TM/ nl carotid upstrokes bilaterally   LUNGS: no acc muscle use,   Min insp and exp rhonchi bilaterally   CV:  RRR  no s3 or murmur or increase in P2, no edema   ABD:  soft and nontender with nl excursion in the supine position. No bruits or organomegaly, bowel sounds nl  MS:  warm without deformities, calf tenderness, cyanosis or clubbing  SKIN: warm and dry without lesions    NEURO:  alert, approp, no deficits      Labs ordered 03/19/2017   Allergy profile       Assessment & Plan:

## 2017-03-19 NOTE — Patient Instructions (Addendum)
Plan A = Automatic = symbicort 80 = dulera 100 up to 2 pffs every 12 hours and continue singulair daily   Work on inhaler technique:  relax and gently blow all the way out then take a nice smooth deep breath back in, triggering the inhaler at same time you start breathing in.  Hold for up to 5 seconds if you can. Blow out thru nose. Rinse and gargle with water when done      Plan B = Backup Only use your albuterol as a rescue medication to be used if you can't catch your breath by resting or doing a relaxed purse lip breathing pattern.  - The less you use it, the better it will work when you need it. - Ok to use the inhaler up to 2 puffs  every 4 hours if you must but call for appointment if use goes up over your usual need - Don't leave home without it !!  (think of it like the spare tire for your car)   Plan C = Crisis - only use your albuterol nebulizer if you first try Plan B and it fails to help > ok to use the nebulizer up to every 4 hours but if start needing it regularly call for immediate appointment   Please remember to go to the lab   department downstairs in the basement  for your tests - we will call you with the results when they are available.     Please schedule a follow up visit in 3 months but call sooner if needed

## 2017-03-20 ENCOUNTER — Telehealth: Payer: Self-pay | Admitting: Internal Medicine

## 2017-03-20 LAB — RESPIRATORY ALLERGY PROFILE REGION II ~~LOC~~
ASPERGILLUS FUMIGATUS M3: 0.4 kU/L — AB
Allergen, A. alternata, m6: <0.1 kU/L
Allergen, C. Herbarum, M2: <0.1 kU/L
Allergen, Cedar tree, t12: <0.1 kU/L
Allergen, Comm Silver Birch, t9: <0.1 kU/L
Allergen, Cottonwood, t14: <0.1 kU/L
Allergen, D pternoyssinus,d7: <0.1 kU/L
Allergen, Mouse Urine Protein, e78: <0.1 kU/L
Allergen, Mulberry, t76: <0.1 kU/L
Allergen, Oak,t7: <0.1 kU/L
Allergen, P. notatum, m1: 0.31 kU/L — ABNORMAL HIGH
Bermuda Grass: <0.1 kU/L
Box Elder IgE: <0.1 kU/L
Cat Dander: 16.3 kU/L — ABNORMAL HIGH
Common Ragweed: 0.1 kU/L — ABNORMAL HIGH
D. farinae: <0.1 kU/L
Dog Dander: 2.52 kU/L — ABNORMAL HIGH
Elm IgE: <0.1 kU/L
IGE (IMMUNOGLOBULIN E), SERUM: 69 kU/L (ref <115)
Johnson Grass: <0.1 kU/L
Pecan/Hickory Tree IgE: 1.56 kU/L — ABNORMAL HIGH
Rough Pigweed  IgE: <0.1 kU/L
Timothy Grass: 0.42 kU/L — ABNORMAL HIGH

## 2017-03-20 MED ORDER — ALBUTEROL SULFATE HFA 108 (90 BASE) MCG/ACT IN AERS: 2.0000 | INHALATION_SPRAY | Freq: Four times a day (QID) | RESPIRATORY_TRACT | 5 refills | Status: DC | PRN

## 2017-03-20 MED ORDER — MONTELUKAST SODIUM 10 MG PO TABS: 10.0000 mg | ORAL_TABLET | Freq: Every day | ORAL | 6 refills | Status: AC

## 2017-03-20 NOTE — Telephone Encounter (Signed)
Called and spoke with pt and she is aware of refills that have been sent to her pharmacy.  Nothing further is needed.

## 2017-03-21 ENCOUNTER — Telehealth: Payer: Self-pay | Admitting: Internal Medicine

## 2017-03-21 NOTE — Telephone Encounter (Signed)
Pt returning call.Brianna Hart ° °

## 2017-03-21 NOTE — Telephone Encounter (Signed)
Lmtcb x1 for pt.    Notes recorded by Tanda Rockers, MD on 03/21/2017 at 9:21 AM EDT Call patient : Studies are c/w mild allergies: Pos Cat > dog, trees, ragweed,grass rec avoid Be sure patient has f/u ov so we can go over all the details of this study and get a plan together moving forward - ok to move up f/u if not feeling better and wants to be seen sooner

## 2017-03-21 NOTE — Progress Notes (Signed)
LMTCB

## 2017-03-21 NOTE — Telephone Encounter (Signed)
Spoke with pt, aware of results/recs.  Nothing further needed.  

## 2017-03-23 NOTE — Assessment & Plan Note (Signed)
Body mass index is 38.86 kg/m.  -  trending up Lab Results  Component Value Date   TSH 1.132 03/02/2009     Contributing to gerd risk/ doe/reviewed the need and the process to achieve and maintain neg calorie balance > defer f/u primary care including intermittently monitoring thyroid status

## 2017-03-23 NOTE — Assessment & Plan Note (Signed)
-   Allergy profile 03/19/2017 >>  Eos 0.3,  IgE  69 RAST Pos Cat > dog, trees, ragweed,grass - 03/19/2017  After extensive coaching HFA effectiveness =    =75%  - Spirometry 03/19/2017  FEV1 2.03 (79%)  Ratio 68 s rx prior    Despite suboptimal hfa and clear evidence of allergies and no using ics consistently she still only has mild  Airflow obst but note with fev1 < 80% fits the criteria for moderate and should be easy to control   rec Continue singulair Restart laba/ics = free symbicort 80 2bid Allergen avoidance  f/ q 3 m  I had an extended discussion with the patient reviewing all relevant studies completed to date and  lasting 15 to 20 minutes of a 25 minute visit    Each maintenance medication was reviewed in detail including most importantly the difference between maintenance and prns and under what circumstances the prns are to be triggered using an action plan format that is not reflected in the computer generated alphabetically organized AVS.    Please see AVS for specific instructions unique to this visit that I personally wrote and verbalized to the the pt in detail and then reviewed with pt  by my nurse highlighting any  changes in therapy recommended at today's visit to their plan of care.

## 2017-03-26 NOTE — Progress Notes (Signed)
LMTCB

## 2017-04-03 ENCOUNTER — Telehealth: Payer: Self-pay | Admitting: Internal Medicine

## 2017-04-03 MED ORDER — BUDESONIDE-FORMOTEROL FUMARATE 80-4.5 MCG/ACT IN AERO: 2.0000 | INHALATION_SPRAY | Freq: Two times a day (BID) | RESPIRATORY_TRACT | 12 refills | Status: AC

## 2017-04-03 NOTE — Telephone Encounter (Signed)
Spoke with pt. Pt is requesting a refill of Symbicort 80. Rx sent to preferred pharmacy. Pt verbalized understanding and denied any further questions or concerns at this time.

## 2017-04-03 NOTE — Telephone Encounter (Signed)
lmomtcb x 1 for the pt 

## 2017-04-18 ENCOUNTER — Encounter: Payer: Self-pay | Admitting: Internal Medicine

## 2017-04-18 ENCOUNTER — Ambulatory Visit (INDEPENDENT_AMBULATORY_CARE_PROVIDER_SITE_OTHER): Payer: Managed Care, Other (non HMO) | Admitting: Internal Medicine

## 2017-04-18 VITALS — BP 106/74 | HR 92 | Temp 98.7°F | Ht 63.0 in | Wt 216.8 lb

## 2017-04-18 DIAGNOSIS — J4540 Moderate persistent asthma, uncomplicated: Secondary | ICD-10-CM

## 2017-04-18 DIAGNOSIS — J45901 Unspecified asthma with (acute) exacerbation: Secondary | ICD-10-CM | POA: Insufficient documentation

## 2017-04-18 DIAGNOSIS — J45998 Other asthma: Secondary | ICD-10-CM | POA: Diagnosis not present

## 2017-04-18 DIAGNOSIS — J4541 Moderate persistent asthma with (acute) exacerbation: Secondary | ICD-10-CM

## 2017-04-18 MED ORDER — ALBUTEROL SULFATE (2.5 MG/3ML) 0.083% IN NEBU: 5.0000 mg | INHALATION_SOLUTION | RESPIRATORY_TRACT | 1 refills | Status: AC | PRN

## 2017-04-18 MED ORDER — PREDNISONE 10 MG PO TABS: ORAL_TABLET | ORAL | 0 refills | Status: DC

## 2017-04-18 NOTE — Patient Instructions (Addendum)
Plan A = Automatic = symbicort 80 Take 2 puffs first thing in am and then another 2 puffs about 12 hours later.    Work on Doctor, hospital technique:  relax and gently blow all the way out then take a nice smooth deep breath back in, triggering the inhaler at same time you start breathing in.  Hold for up to 5 seconds if you can. Blow out thru nose. Rinse and gargle with water when done      Plan B = Backup Only use your albuterol as a rescue medication to be used if you can't catch your breath by resting or doing a relaxed purse lip breathing pattern.  - The less you use it, the better it will work when you need it. - Ok to use the inhaler up to 2 puffs  every 4 hours if you must but call for appointment if use goes up over your usual need - Don't leave home without it !!  (think of it like the spare tire for your car)   Plan C = Crisis - only use your albuterol nebulizer if you first try Plan B and it fails to help > ok to use the nebulizer up to every 4 hours but if start needing it regularly call for immediate appointment  Prednisone 10 mg take  4 each am x 2 days,   2 each am x 2 days,  1 each am x 2 days and stop    Call if not all better by 6/8 am  - otherwise just keep previous appt

## 2017-04-18 NOTE — Assessment & Plan Note (Signed)
Body mass index is 38.4 kg/m.  -  trending down slightly / encouraged Lab Results  Component Value Date   TSH 1.132 03/02/2009     Contributing to gerd risk/ doe/reviewed the need and the process to achieve and maintain neg calorie balance > defer f/u primary care including intermittently monitoring thyroid status

## 2017-04-18 NOTE — Assessment & Plan Note (Signed)
See ov 04/18/2017  rec Prednisone 10 mg take  4 each am x 2 days,   2 each am x 2 days,  1 each am x 2 days and stop and changed maint rx symb 80 2bid   See sep a/p

## 2017-04-18 NOTE — Progress Notes (Signed)
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 Pulmonary Consult    Self referral. Pt states dxed with Asthma as a child. She c/o increased SOB and wheezing for the past 4 months. She states that her breathing is esp worse first thing in the am and in the evening. She has also had some non prod cough and CP off and on. She is using proventil hfa inhaler 3 x daily on average and albuterol neb 4-6 x per day.   present on awakening each am uses neb immediately on rising except nothing on day of ov Nasal congestion/ mild wheeze/ lower midline cp  pred improved a bunch but didn't last beyond mid August 36  rec Plan A = automatic = dulera 200 Take 2 puffs first thing in am and then another 2 puffs about 12 hours later.  Plan B = backup Only use your albuterol as a rescue medication   Prednisone 10 mg take  4 each am x 2 days,   2 each am x 2 days,  1 each am x 2 days and stop Please schedule a follow up office visit in 2 weeks, sooner if needed  - bring all inhalers with you    08/16/2015  f/u ov/Brianna Hart re: chronic asthma/ maint on dulera 200 2bid  Chief Complaint  Patient presents with  . Follow-up    Pt states her breathing is much improved. She is coughing less and has not had any more wheezing or CP. No new co's today. She has not had to use her  albuterol inhaler or neb.    still some residual nasal congestion  rec Continue dulera 200 Take 2 puffs first thing in am and then another 2 puffs about 12 hours later.  If not 100% satisfied with nasal symptoms or breathing next add generic singulair 10 mg each pm Let me know if any problems filling your prescription- we can always give you samples Please schedule a follow up visit in 3 months but call sooner if needed  > did not do    03/19/2017 acute extended ov/Brianna Hart re: chronic asthma / maint singulair  Chief Complaint  Patient presents with  . Acute Visit    Increased wheezing, cough and SOB for the past month.  She is using her albuterol inhaler at least once per day, and has not needed neb.   out of dulera x months / rare need for albuterol while being maintained on singulair but x 4 weeks needing albtuerol up to once a day around noon to control wheeze/ dry cough and sob assoc with increased nasal congestion with the spring weather typical for her though thinks singulair helping nasal symptoms vs baseline rec Plan A = Automatic = symbicort 80 =  dulera 100 up to 2 pffs every 12 hours and continue singulair daily  Work on inhaler technique:  Plan B = Backup Only use your albuterol as a rescue medication  Plan C = Crisis - only use your albuterol nebulizer if you first try Plan B and it fails to help > ok to use the nebulizer up to every 4 hours but if start needing it regularly call for immediate appointment       04/18/2017  Acute extended ov/Brianna Hart re:  Chronic asthma / maint singulair and symb 80 x 2 each am  Chief Complaint  Patient presents with  . Acute Visit    Pt c/o coughing and wheezing for the past day. She started feeling bad with runny nose and scratchy throat approx 1 wk ago. She had a fever 5 days ago.    baseline maint as on 1 week >  symbicort 2 / no saba Onset was one week prior to OV  And did not increase symb to 2 bid as rec  No longer st or fever but worsening  sob/ need for saba up to every 4 h prn  No obvious day to day or daytime variability or assoc excess/ purulent sputum or mucus plugs or hemoptysis or cp or chest tightness, subjective wheeze or overt sinus or hb symptoms. No unusual exp hx or h/o childhood pna/ asthma or knowledge of premature birth.  Sleeping ok without nocturnal  or early am exacerbation  of respiratory  c/o's or need for noct saba. Also denies any obvious fluctuation of symptoms with weather or environmental changes or other aggravating or alleviating factors except as outlined above   Current Medications, Allergies, Complete Past Medical History, Past Surgical History, Family History, and Social History were reviewed in [employer] record.  ROS  The following are not active complaints unless bolded sore throat, dysphagia, dental problems, itching, sneezing,  nasal congestion or excess/ purulent secretions, ear ache,   fever, chills, sweats, unintended wt loss, classically pleuritic or exertional cp,  orthopnea pnd or leg swelling, presyncope, palpitations, abdominal pain, anorexia, nausea, vomiting, diarrhea  or change in bowel or bladder habits, change in stools or urine, dysuria,hematuria,  rash, arthralgias, visual complaints, headache, numbness, weakness or ataxia or problems with walking or coordination,  change in mood/affect or memory.                                     Objective:   Physical Exam  amb bf nad at rest but audible wheeze    .08/16/2015        200 > 03/19/2017    220 > 04/18/2017   217       07/29/15 198 lb 12.8 oz (90.175 kg)  02/22/10 165 lb (74.844 kg)  12/23/09 169 lb (76.658 kg)    Vital signs reviewed - Note on arrival 02 sats  98% on RA      HEENT: nl dentition,  and oropharynx. Nl external ear canals without cough reflex - mild to moderate bilateral non-specific turbinate edema     NECK :  without JVD/Nodes/TM/ nl carotid upstrokes bilaterally   LUNGS: no  acc muscle use,     insp and exp sonorous rhonchi bilaterally 4 h since saba > better after saba x 4 pffs  CV:  RRR  no s3 or murmur or increase in P2, no edema   ABD:  soft and nontender with nl excursion in the supine position. No bruits or organomegaly, bowel sounds nl  MS:  warm without deformities, calf tenderness, cyanosis or clubbing  SKIN: warm and dry without lesions    NEURO:  alert, approp, no deficits           Assessment & Plan:

## 2017-04-18 NOTE — Assessment & Plan Note (Signed)
-   Allergy profile 03/19/2017 >>  Eos 0.3,  IgE  69 RAST Pos Cat > dog, trees, ragweed,grass - 03/19/2017  After extensive coaching HFA effectiveness =    =75%  - Spirometry 03/19/2017  FEV1 2.03 (79%)  Ratio 68 s rx prior   - 04/18/2017  After extensive coaching HFA effectiveness =    75% (short Ti)   Acute flare in setting of apparent uri and with misunderstanding on how to use symbicort 80 (suboptimal hfa and never increased to 2 bid dosing even when noted need for rescue saba)   rec Prednisone 10 mg take  4 each am x 2 days,   2 each am x 2 days,  1 each am x 2 days and stop  maint with symb 80 2bid  I had an extended discussion with the patient reviewing all relevant studies completed to date and  lasting 25 minutes of a 40  minute acute office visit addressing   severe non-specific but potentially very serious refractory respiratory symptoms of uncertain and potentially multiple  etiologies.   Each maintenance medication was reviewed in detail including most importantly the difference between maintenance and prns and under what circumstances the prns are to be triggered using an action plan format that is not reflected in the computer generated alphabetically organized AVS.    Please see AVS for specific instructions unique to this office visit that I personally wrote and verbalized to the the pt in detail and then reviewed with pt  by my nurse highlighting any changes in therapy/plan of care  recommended at today's visit.

## 2017-04-20 ENCOUNTER — Telehealth: Payer: Self-pay | Admitting: Internal Medicine

## 2017-04-20 DIAGNOSIS — J4540 Moderate persistent asthma, uncomplicated: Secondary | ICD-10-CM

## 2017-04-20 NOTE — Telephone Encounter (Signed)
Spoke with pt. She is in need of a neb machine and neb tubing kits. Order has been placed for both. Nothing further was needed.

## 2017-04-20 NOTE — Telephone Encounter (Signed)
Pt returning call.Brianna Hart'

## 2017-04-20 NOTE — Telephone Encounter (Signed)
lmtcb x1 for pt. 

## 2017-04-20 NOTE — Telephone Encounter (Signed)
lmtcb x2 for pt. 

## 2017-04-20 NOTE — Telephone Encounter (Signed)
Patient returned call, [phone].

## 2017-06-18 ENCOUNTER — Ambulatory Visit: Payer: Managed Care, Other (non HMO) | Admitting: Internal Medicine

## 2017-08-06 ENCOUNTER — Other Ambulatory Visit: Payer: Self-pay | Admitting: Internal Medicine

## 2017-08-06 MED ORDER — MONTELUKAST SODIUM 10 MG PO TABS: 10.0000 mg | ORAL_TABLET | Freq: Every day | ORAL | 3 refills | Status: AC

## 2017-08-07 ENCOUNTER — Other Ambulatory Visit: Payer: Self-pay | Admitting: Internal Medicine

## 2017-11-25 DIAGNOSIS — F330 Major depressive disorder, recurrent, mild: Secondary | ICD-10-CM | POA: Diagnosis not present

## 2017-11-25 DIAGNOSIS — F4310 Post-traumatic stress disorder, unspecified: Secondary | ICD-10-CM | POA: Diagnosis not present

## 2017-12-23 DIAGNOSIS — F4310 Post-traumatic stress disorder, unspecified: Secondary | ICD-10-CM | POA: Diagnosis not present

## 2017-12-23 DIAGNOSIS — F330 Major depressive disorder, recurrent, mild: Secondary | ICD-10-CM | POA: Diagnosis not present

## 2017-12-31 DIAGNOSIS — M5137 Other intervertebral disc degeneration, lumbosacral region: Secondary | ICD-10-CM | POA: Diagnosis not present

## 2017-12-31 DIAGNOSIS — M9905 Segmental and somatic dysfunction of pelvic region: Secondary | ICD-10-CM | POA: Diagnosis not present

## 2017-12-31 DIAGNOSIS — M5386 Other specified dorsopathies, lumbar region: Secondary | ICD-10-CM | POA: Diagnosis not present

## 2017-12-31 DIAGNOSIS — M9903 Segmental and somatic dysfunction of lumbar region: Secondary | ICD-10-CM | POA: Diagnosis not present

## 2018-01-02 DIAGNOSIS — M5137 Other intervertebral disc degeneration, lumbosacral region: Secondary | ICD-10-CM | POA: Diagnosis not present

## 2018-01-02 DIAGNOSIS — M9905 Segmental and somatic dysfunction of pelvic region: Secondary | ICD-10-CM | POA: Diagnosis not present

## 2018-01-02 DIAGNOSIS — M5386 Other specified dorsopathies, lumbar region: Secondary | ICD-10-CM | POA: Diagnosis not present

## 2018-01-02 DIAGNOSIS — M9903 Segmental and somatic dysfunction of lumbar region: Secondary | ICD-10-CM | POA: Diagnosis not present

## 2018-01-07 DIAGNOSIS — M5137 Other intervertebral disc degeneration, lumbosacral region: Secondary | ICD-10-CM | POA: Diagnosis not present

## 2018-01-07 DIAGNOSIS — M5386 Other specified dorsopathies, lumbar region: Secondary | ICD-10-CM | POA: Diagnosis not present

## 2018-01-07 DIAGNOSIS — M9903 Segmental and somatic dysfunction of lumbar region: Secondary | ICD-10-CM | POA: Diagnosis not present

## 2018-01-07 DIAGNOSIS — M9905 Segmental and somatic dysfunction of pelvic region: Secondary | ICD-10-CM | POA: Diagnosis not present

## 2018-01-10 DIAGNOSIS — M5137 Other intervertebral disc degeneration, lumbosacral region: Secondary | ICD-10-CM | POA: Diagnosis not present

## 2018-01-10 DIAGNOSIS — M9903 Segmental and somatic dysfunction of lumbar region: Secondary | ICD-10-CM | POA: Diagnosis not present

## 2018-01-10 DIAGNOSIS — M9905 Segmental and somatic dysfunction of pelvic region: Secondary | ICD-10-CM | POA: Diagnosis not present

## 2018-01-10 DIAGNOSIS — M5386 Other specified dorsopathies, lumbar region: Secondary | ICD-10-CM | POA: Diagnosis not present

## 2018-08-01 ENCOUNTER — Other Ambulatory Visit: Payer: Self-pay | Admitting: Internal Medicine

## 2018-08-04 ENCOUNTER — Other Ambulatory Visit: Payer: Self-pay

## 2018-08-04 ENCOUNTER — Encounter (HOSPITAL_COMMUNITY): Payer: Self-pay | Admitting: Obstetrics and Gynecology

## 2018-08-04 DIAGNOSIS — Z79899 Other long term (current) drug therapy: Secondary | ICD-10-CM | POA: Diagnosis not present

## 2018-08-04 DIAGNOSIS — R0602 Shortness of breath: Secondary | ICD-10-CM | POA: Diagnosis not present

## 2018-08-04 DIAGNOSIS — J4521 Mild intermittent asthma with (acute) exacerbation: Secondary | ICD-10-CM | POA: Insufficient documentation

## 2018-08-04 DIAGNOSIS — R062 Wheezing: Secondary | ICD-10-CM | POA: Insufficient documentation

## 2018-08-04 MED ORDER — IPRATROPIUM-ALBUTEROL 0.5-2.5 (3) MG/3ML IN SOLN
RESPIRATORY_TRACT | Status: AC
  Filled 2018-08-04: qty 3

## 2018-08-04 MED ORDER — ALBUTEROL SULFATE (2.5 MG/3ML) 0.083% IN NEBU: 5.0000 mg | INHALATION_SOLUTION | Freq: Once | RESPIRATORY_TRACT | Status: DC

## 2018-08-04 MED ORDER — IPRATROPIUM-ALBUTEROL 0.5-2.5 (3) MG/3ML IN SOLN
3.0000 mL | Freq: Once | RESPIRATORY_TRACT | Status: AC
Start: 2018-08-04 — End: 2018-08-04
  Administered 2018-08-04: 3 mL via RESPIRATORY_TRACT

## 2018-08-04 NOTE — ED Triage Notes (Signed)
Pt reports she is having an asthma attack. Pt was having difficulty speaking and was in active distress. RN started nebulizer tx. Wheezing was audible with exhalation

## 2018-08-05 ENCOUNTER — Emergency Department (HOSPITAL_COMMUNITY)
Admission: EM | Admit: 2018-08-05 | Discharge: 2018-08-05 | Disposition: A | Discharge status: Home / Self Care | Payer: BLUE CROSS/BLUE SHIELD | Attending: Emergency Medicine | Admitting: Emergency Medicine

## 2018-08-05 DIAGNOSIS — J4521 Mild intermittent asthma with (acute) exacerbation: Secondary | ICD-10-CM

## 2018-08-05 MED ORDER — ALBUTEROL SULFATE HFA 108 (90 BASE) MCG/ACT IN AERS: 2.0000 | INHALATION_SPRAY | Freq: Four times a day (QID) | RESPIRATORY_TRACT | 0 refills | Status: AC | PRN

## 2018-08-05 MED ORDER — ALBUTEROL SULFATE HFA 108 (90 BASE) MCG/ACT IN AERS
2.0000 | INHALATION_SPRAY | RESPIRATORY_TRACT | Status: DC | PRN
  Administered 2018-08-05: 2 via RESPIRATORY_TRACT
  Filled 2018-08-05: qty 6.7

## 2018-08-05 MED ORDER — IPRATROPIUM BROMIDE 0.02 % IN SOLN
0.5000 mg | Freq: Once | RESPIRATORY_TRACT | Status: AC
  Administered 2018-08-05: 0.5 mg via RESPIRATORY_TRACT
  Filled 2018-08-05: qty 2.5

## 2018-08-05 MED ORDER — PREDNISONE 20 MG PO TABS
60.0000 mg | ORAL_TABLET | Freq: Once | ORAL | Status: AC
  Administered 2018-08-05: 60 mg via ORAL
  Filled 2018-08-05: qty 3

## 2018-08-05 MED ORDER — ALBUTEROL SULFATE (2.5 MG/3ML) 0.083% IN NEBU
5.0000 mg | INHALATION_SOLUTION | Freq: Once | RESPIRATORY_TRACT | Status: AC
  Administered 2018-08-05: 5 mg via RESPIRATORY_TRACT
  Filled 2018-08-05: qty 6

## 2018-08-05 MED ORDER — AEROCHAMBER PLUS FLO-VU MEDIUM MISC
1.0000 | Freq: Once | Status: AC
  Administered 2018-08-05: 1
  Filled 2018-08-05: qty 1

## 2018-08-05 MED ORDER — PREDNISONE 20 MG PO TABS: 40.0000 mg | ORAL_TABLET | Freq: Every day | ORAL | 0 refills | Status: AC

## 2018-08-05 NOTE — Discharge Instructions (Signed)
1. Medications: albuterol, prednisone, usual home medications °2. Treatment: rest, drink plenty of fluids, begin OTC antihistamine (Zyrtec or Claritin)  °3. Follow Up: Please followup with your primary doctor in 2-3 days for discussion of your diagnoses and further evaluation after today's visit; if you do not have a primary care doctor use the resource guide provided to find one; Please return to the ER for difficulty breathing, high fevers or worsening symptoms. ° °

## 2018-08-05 NOTE — ED Provider Notes (Signed)
[facility] DEPT Provider Note   CSN: [id number] Arrival date & time: 08/04/18  2205     History   Chief Complaint Chief Complaint  Patient presents with  . Asthma    HPI Brianna Hart is a 37 y.o. female with a hx of asthma presents to the Emergency Department complaining of gradual, persistent, progressively worsening shortness of breath and wheezing onset personally 1 hour prior to arrival.  She reports associated URI symptoms for the last 3 days including nasal congestion, cough, sore throat, postnasal drip.  She denies fevers, chills, neck pain, neck stiffness, chest pain, abdominal pain, nausea, vomiting, diarrhea.  Patient reports that she has had increased use of her inhaler over the last several days however she ran out this afternoon.  She reports that on her way here to the emergency department she had a full-blown asthma attack.  She reports it is been many years since she was hospitalized for this.  He has never had to be intubated.  She reports her last course of steroids was approximately 2 years ago.  No treatments prior to arrival.  She reports URI symptoms always make her asthma worse.  Initial albuterol given in triage did make her shortness of breath and wheezing better.  The history is provided by the patient and medical records. No language interpreter was used.    Past Medical History:  Diagnosis Date  . Asthma     Patient Active Problem List   Diagnosis Date Noted  . Acute asthma flare 04/18/2017  . Moderate persistent chronic asthma without complication 92/09/9416  . Morbid obesity due to excess calories ([place]) 07/29/2015  . ANEMIA 02/22/2010  . DEPRESSION 02/22/2010  . INCONTINENCE, URGE 02/22/2010  . URINARY URGENCY, CHRONIC 02/22/2010  . VAGINAL PRURITUS 12/23/2009  . THYROMEGALY 03/02/2009  . ALLERGIC RHINITIS WITH CONJUNCTIVITIS 03/02/2009  . LUQ PAIN 03/02/2009  . POLYCYSTIC OVARIAN DISEASE 12/06/2007    Past  Surgical History:  Procedure Laterality Date  . WISDOM TOOTH EXTRACTION       OB History   None      Home Medications    Prior to Admission medications   Medication Sig Start Date End Date Taking? Authorizing Provider  albuterol (PROVENTIL HFA) 108 (90 Base) MCG/ACT inhaler Inhale 2 puffs into the lungs every 6 (six) hours as needed for wheezing or shortness of breath. 08/05/18   Mollye Guinta, Jarrett Soho, PA-C  albuterol (PROVENTIL) (2.5 MG/3ML) 0.083% nebulizer solution Take 6 mLs (5 mg total) by nebulization every 4 (four) hours as needed for wheezing or shortness of breath. 04/18/17   Tanda Rockers, MD  budesonide-formoterol (SYMBICORT) 80-4.5 MCG/ACT inhaler Inhale 2 puffs into the lungs 2 (two) times daily. 04/03/17   Tanda Rockers, MD  folic acid (FOLVITE) 1 MG tablet Take 1 tablet by mouth daily. 06/13/15   [provider]  montelukast (SINGULAIR) 10 MG tablet Take 1 tablet (10 mg total) by mouth at bedtime. 03/20/17   Tanda Rockers, MD  montelukast (SINGULAIR) 10 MG tablet Take 1 tablet (10 mg total) by mouth at bedtime. 08/06/17   Tanda Rockers, MD  predniSONE (DELTASONE) 20 MG tablet Take 2 tablets (40 mg total) by mouth daily. 08/05/18   Charlestine Rookstool, Jarrett Soho, PA-C  RECLIPSEN 0.15-30 MG-MCG tablet Take 1 tablet by mouth daily. 05/30/15   [provider]    Family History No family history on file.  Social History Social History   Tobacco Use  . Smoking status: Never  Smoker  . Smokeless tobacco: Never Used  Substance Use Topics  . Alcohol use: Yes    Alcohol/week: 0.0 standard drinks    Comment: 1-3 drinks per month  . Drug use: No     Allergies   Patient has no known allergies.   Review of Systems Review of Systems  Constitutional: Negative for appetite change, diaphoresis, fatigue, fever and unexpected weight change.  HENT: Positive for congestion, postnasal drip, rhinorrhea and sore throat. Negative for mouth sores.   Eyes: Negative for visual  disturbance.  Respiratory: Positive for chest tightness, shortness of breath and wheezing. Negative for cough.   Cardiovascular: Negative for chest pain.  Gastrointestinal: Negative for abdominal pain, constipation, diarrhea, nausea and vomiting.  Endocrine: Negative for polydipsia, polyphagia and polyuria.  Genitourinary: Negative for dysuria, frequency, hematuria and urgency.  Musculoskeletal: Negative for back pain and neck stiffness.  Skin: Negative for rash.  Allergic/Immunologic: Negative for immunocompromised state.  Neurological: Negative for syncope, light-headedness and headaches.  Hematological: Does not bruise/bleed easily.  Psychiatric/Behavioral: Negative for sleep disturbance. The patient is not nervous/anxious.      Physical Exam Updated Vital Signs BP 123/73 (BP Location: Right Arm)   Pulse 94   Temp 97.6 F (36.4 C) (Oral)   Resp 18   LMP 07/22/2018 (Approximate)   SpO2 100%   Physical Exam  Constitutional: She appears well-developed and well-nourished. No distress.  HENT:  Head: Normocephalic and atraumatic.  Right Ear: Tympanic membrane, external ear and ear canal normal.  Left Ear: Tympanic membrane, external ear and ear canal normal.  Nose: Mucosal edema and rhinorrhea present. No epistaxis. Right sinus exhibits no maxillary sinus tenderness and no frontal sinus tenderness. Left sinus exhibits no maxillary sinus tenderness and no frontal sinus tenderness.  Mouth/Throat: Uvula is midline and mucous membranes are normal. Mucous membranes are not pale and not cyanotic. No oropharyngeal exudate, posterior oropharyngeal edema, posterior oropharyngeal erythema or tonsillar abscesses.  Eyes: Pupils are equal, round, and reactive to light. Conjunctivae are normal.  Neck: Normal range of motion and full passive range of motion without pain.  Cardiovascular: Normal rate and intact distal pulses.  Pulmonary/Chest: Accessory muscle usage present. No stridor. Tachypnea  noted. She has wheezes.  Patient with inspiratory and expiratory wheezing.  Mild tachypnea and accessory muscle usage.  No significant respiratory distress.  Abdominal: Soft. There is no tenderness.  Musculoskeletal: Normal range of motion.  Lymphadenopathy:    She has no cervical adenopathy.  Neurological: She is alert.  Skin: Skin is warm and dry. No rash noted. She is not diaphoretic.  Psychiatric: She has a normal mood and affect.  Nursing note and vitals reviewed.    ED Treatments / Results   Procedures Procedures (including critical care time)  Medications Ordered in ED Medications  albuterol (PROVENTIL) (2.5 MG/3ML) 0.083% nebulizer solution 5 mg (5 mg Nebulization Not Given 08/05/18 0126)  ipratropium-albuterol (DUONEB) 0.5-2.5 (3) MG/3ML nebulizer solution (  Not Given 08/04/18 2215)  albuterol (PROVENTIL HFA;VENTOLIN HFA) 108 (90 Base) MCG/ACT inhaler 2 puff (2 puffs Inhalation Provided for home use 08/05/18 0334)  ipratropium-albuterol (DUONEB) 0.5-2.5 (3) MG/3ML nebulizer solution 3 mL (3 mLs Nebulization Given 08/04/18 2214)  albuterol (PROVENTIL) (2.5 MG/3ML) 0.083% nebulizer solution 5 mg (5 mg Nebulization Given 08/05/18 0126)  ipratropium (ATROVENT) nebulizer solution 0.5 mg (0.5 mg Nebulization Given 08/05/18 0126)  predniSONE (DELTASONE) tablet 60 mg (60 mg Oral Given 08/05/18 0127)  albuterol (PROVENTIL) (2.5 MG/3ML) 0.083% nebulizer solution 5 mg (5 mg Nebulization  Given 08/05/18 0251)  ipratropium (ATROVENT) nebulizer solution 0.5 mg (0.5 mg Nebulization Given 08/05/18 0251)  AEROCHAMBER PLUS FLO-VU MEDIUM MISC 1 each (1 each Other Provided for home use 08/05/18 0334)     Initial Impression / Assessment and Plan / ED Course  I have reviewed the triage vital signs and the nursing notes.  Pertinent labs & imaging results that were available during my care of the patient were reviewed by me and considered in my medical decision making (see chart for details).  Clinical  Course as of Aug 05 410  Mon Aug 05, 2018  0229 Pt with improved breath sounds, but persistent expiratory wheezes.  Will give additional nebulizer.   [HM]  0330 Clear and equal breath sounds after third albuterol treatment.  Patient reports she is breathing at baseline and feels significantly better.   [HM]  0330 Initial tachypnea has resolved completely.  Resp(!): 24 [HM]    Clinical Course User Index [HM] Mecca Barga, Jarrett Soho, PA-C    Presents with asthma exacerbation.  She has had 3 albuterol and Atrovent nebulizers with significant improvement.  Breath sounds now clear and equal.  Patient also given prednisone.  Patient is afebrile without focal breath sounds.  Highly doubt pneumonia.  Patient has remained without hypoxia throughout her time here in the emergency department.  No indication for chest x-ray at this time.  Patient low risk for DVT/PE.  PERC negative. Will give rescue inhaler here in the emergency department and discharged home with additional prescription along with steroids.  Discussed close follow-up with primary care provider.    Final Clinical Impressions(s) / ED Diagnoses   Final diagnoses:  Mild intermittent asthma with exacerbation    ED Discharge Orders         Ordered    albuterol (PROVENTIL HFA) 108 (90 Base) MCG/ACT inhaler  Every 6 hours PRN     08/05/18 0320    predniSONE (DELTASONE) 20 MG tablet  Daily     08/05/18 0320           Robert Sunga, Jarrett Soho, PA-C 08/05/18 0411    Cardama, Grayce Sessions, MD 08/06/18 [phone]

## 2018-08-05 NOTE — ED Notes (Signed)
Bed: [facility] Expected date:  Expected time:  Means of arrival:  Comments: 

## 2018-09-09 DIAGNOSIS — Z1389 Encounter for screening for other disorder: Secondary | ICD-10-CM | POA: Diagnosis not present

## 2018-09-09 DIAGNOSIS — Z6837 Body mass index (BMI) 37.0-37.9, adult: Secondary | ICD-10-CM | POA: Diagnosis not present

## 2018-09-09 DIAGNOSIS — Z1329 Encounter for screening for other suspected endocrine disorder: Secondary | ICD-10-CM | POA: Diagnosis not present

## 2018-09-09 DIAGNOSIS — Z130 Encounter for screening for diseases of the blood and blood-forming organs and certain disorders involving the immune mechanism: Secondary | ICD-10-CM | POA: Diagnosis not present

## 2018-09-09 DIAGNOSIS — R87610 Atypical squamous cells of undetermined significance on cytologic smear of cervix (ASC-US): Secondary | ICD-10-CM | POA: Diagnosis not present

## 2018-09-09 DIAGNOSIS — Z0000 Encounter for general adult medical examination without abnormal findings: Secondary | ICD-10-CM | POA: Diagnosis not present

## 2018-09-09 DIAGNOSIS — Z01419 Encounter for gynecological examination (general) (routine) without abnormal findings: Secondary | ICD-10-CM | POA: Diagnosis not present

## 2018-09-09 DIAGNOSIS — Z13220 Encounter for screening for lipoid disorders: Secondary | ICD-10-CM | POA: Diagnosis not present

## 2018-11-18 DIAGNOSIS — E669 Obesity, unspecified: Secondary | ICD-10-CM | POA: Diagnosis not present

## 2018-11-18 DIAGNOSIS — Z6838 Body mass index (BMI) 38.0-38.9, adult: Secondary | ICD-10-CM | POA: Diagnosis not present

## 2018-12-23 DIAGNOSIS — Z6836 Body mass index (BMI) 36.0-36.9, adult: Secondary | ICD-10-CM | POA: Diagnosis not present

## 2018-12-23 DIAGNOSIS — E781 Pure hyperglyceridemia: Secondary | ICD-10-CM | POA: Diagnosis not present

## 2018-12-23 DIAGNOSIS — E669 Obesity, unspecified: Secondary | ICD-10-CM | POA: Diagnosis not present

## 2019-06-02 DIAGNOSIS — M9903 Segmental and somatic dysfunction of lumbar region: Secondary | ICD-10-CM | POA: Diagnosis not present

## 2019-06-02 DIAGNOSIS — M9905 Segmental and somatic dysfunction of pelvic region: Secondary | ICD-10-CM | POA: Diagnosis not present

## 2019-06-02 DIAGNOSIS — M9901 Segmental and somatic dysfunction of cervical region: Secondary | ICD-10-CM | POA: Diagnosis not present

## 2019-06-02 DIAGNOSIS — M9902 Segmental and somatic dysfunction of thoracic region: Secondary | ICD-10-CM | POA: Diagnosis not present

## 2019-06-09 DIAGNOSIS — M9902 Segmental and somatic dysfunction of thoracic region: Secondary | ICD-10-CM | POA: Diagnosis not present

## 2019-06-09 DIAGNOSIS — M9905 Segmental and somatic dysfunction of pelvic region: Secondary | ICD-10-CM | POA: Diagnosis not present

## 2019-06-09 DIAGNOSIS — M9903 Segmental and somatic dysfunction of lumbar region: Secondary | ICD-10-CM | POA: Diagnosis not present

## 2019-06-09 DIAGNOSIS — M9901 Segmental and somatic dysfunction of cervical region: Secondary | ICD-10-CM | POA: Diagnosis not present

## 2019-06-16 DIAGNOSIS — M9903 Segmental and somatic dysfunction of lumbar region: Secondary | ICD-10-CM | POA: Diagnosis not present

## 2019-06-16 DIAGNOSIS — M9901 Segmental and somatic dysfunction of cervical region: Secondary | ICD-10-CM | POA: Diagnosis not present

## 2019-06-16 DIAGNOSIS — M9905 Segmental and somatic dysfunction of pelvic region: Secondary | ICD-10-CM | POA: Diagnosis not present

## 2019-06-16 DIAGNOSIS — M9902 Segmental and somatic dysfunction of thoracic region: Secondary | ICD-10-CM | POA: Diagnosis not present

## 2019-08-25 DIAGNOSIS — M9903 Segmental and somatic dysfunction of lumbar region: Secondary | ICD-10-CM | POA: Diagnosis not present

## 2019-08-25 DIAGNOSIS — M9901 Segmental and somatic dysfunction of cervical region: Secondary | ICD-10-CM | POA: Diagnosis not present

## 2019-08-25 DIAGNOSIS — M9902 Segmental and somatic dysfunction of thoracic region: Secondary | ICD-10-CM | POA: Diagnosis not present

## 2019-08-25 DIAGNOSIS — M9905 Segmental and somatic dysfunction of pelvic region: Secondary | ICD-10-CM | POA: Diagnosis not present

## 2019-09-15 DIAGNOSIS — Z1329 Encounter for screening for other suspected endocrine disorder: Secondary | ICD-10-CM | POA: Diagnosis not present

## 2019-09-15 DIAGNOSIS — Z6835 Body mass index (BMI) 35.0-35.9, adult: Secondary | ICD-10-CM | POA: Diagnosis not present

## 2019-09-15 DIAGNOSIS — Z130 Encounter for screening for diseases of the blood and blood-forming organs and certain disorders involving the immune mechanism: Secondary | ICD-10-CM | POA: Diagnosis not present

## 2019-09-15 DIAGNOSIS — E559 Vitamin D deficiency, unspecified: Secondary | ICD-10-CM | POA: Diagnosis not present

## 2019-09-15 DIAGNOSIS — Z13220 Encounter for screening for lipoid disorders: Secondary | ICD-10-CM | POA: Diagnosis not present

## 2019-09-15 DIAGNOSIS — Z01419 Encounter for gynecological examination (general) (routine) without abnormal findings: Secondary | ICD-10-CM | POA: Diagnosis not present

## 2019-09-15 DIAGNOSIS — Z0000 Encounter for general adult medical examination without abnormal findings: Secondary | ICD-10-CM | POA: Diagnosis not present

## 2019-11-17 DIAGNOSIS — M9902 Segmental and somatic dysfunction of thoracic region: Secondary | ICD-10-CM | POA: Diagnosis not present

## 2019-11-17 DIAGNOSIS — M9903 Segmental and somatic dysfunction of lumbar region: Secondary | ICD-10-CM | POA: Diagnosis not present

## 2019-11-17 DIAGNOSIS — M9905 Segmental and somatic dysfunction of pelvic region: Secondary | ICD-10-CM | POA: Diagnosis not present

## 2019-11-17 DIAGNOSIS — M9901 Segmental and somatic dysfunction of cervical region: Secondary | ICD-10-CM | POA: Diagnosis not present

## 2020-01-12 DIAGNOSIS — M9901 Segmental and somatic dysfunction of cervical region: Secondary | ICD-10-CM | POA: Diagnosis not present

## 2020-01-12 DIAGNOSIS — M9903 Segmental and somatic dysfunction of lumbar region: Secondary | ICD-10-CM | POA: Diagnosis not present

## 2020-01-12 DIAGNOSIS — M9905 Segmental and somatic dysfunction of pelvic region: Secondary | ICD-10-CM | POA: Diagnosis not present

## 2020-01-12 DIAGNOSIS — M9902 Segmental and somatic dysfunction of thoracic region: Secondary | ICD-10-CM | POA: Diagnosis not present
